# Patient Record
Sex: Male | Born: 1960
Health system: Southern US, Community
[De-identification: ages and names within clinical notes are randomized; demographics above are authoritative.]

## PROBLEM LIST (undated history)

## (undated) DIAGNOSIS — E785 Hyperlipidemia, unspecified: Secondary | ICD-10-CM

## (undated) DIAGNOSIS — K219 Gastro-esophageal reflux disease without esophagitis: Secondary | ICD-10-CM

## (undated) DIAGNOSIS — E119 Type 2 diabetes mellitus without complications: Secondary | ICD-10-CM

## (undated) DIAGNOSIS — I10 Essential (primary) hypertension: Secondary | ICD-10-CM

## (undated) DIAGNOSIS — J309 Allergic rhinitis, unspecified: Secondary | ICD-10-CM

## (undated) DIAGNOSIS — I2510 Atherosclerotic heart disease of native coronary artery without angina pectoris: Secondary | ICD-10-CM

## (undated) HISTORY — DX: Hyperlipidemia, unspecified: E78.5

## (undated) HISTORY — DX: Essential (primary) hypertension: I10

## (undated) HISTORY — DX: Allergic rhinitis, unspecified: J30.9

## (undated) HISTORY — DX: Atherosclerotic heart disease of native coronary artery without angina pectoris: I25.10

## (undated) HISTORY — DX: Gastro-esophageal reflux disease without esophagitis: K21.9

## (undated) HISTORY — DX: Type 2 diabetes mellitus without complications: E11.9

---

## 1999-12-28 ENCOUNTER — Emergency Department (HOSPITAL_COMMUNITY): Admission: EM | Admit: 1999-12-28 | Discharge: 1999-12-28 | Payer: Self-pay | Admitting: Emergency Medicine

## 1999-12-28 ENCOUNTER — Encounter: Payer: Self-pay | Admitting: Emergency Medicine

## 2000-12-04 HISTORY — PX: CORONARY STENT PLACEMENT: SHX1402

## 2001-12-02 ENCOUNTER — Inpatient Hospital Stay (HOSPITAL_COMMUNITY): Admission: EM | Admit: 2001-12-02 | Discharge: 2001-12-04 | Payer: Self-pay | Admitting: Emergency Medicine

## 2001-12-02 ENCOUNTER — Encounter: Payer: Self-pay | Admitting: Emergency Medicine

## 2001-12-31 ENCOUNTER — Encounter (HOSPITAL_COMMUNITY): Admission: RE | Admit: 2001-12-31 | Discharge: 2002-03-31 | Payer: Self-pay | Admitting: Cardiology

## 2008-05-10 ENCOUNTER — Emergency Department (HOSPITAL_COMMUNITY): Admission: EM | Admit: 2008-05-10 | Discharge: 2008-05-10 | Payer: Self-pay | Admitting: Emergency Medicine

## 2008-05-21 ENCOUNTER — Emergency Department (HOSPITAL_COMMUNITY): Admission: EM | Admit: 2008-05-21 | Discharge: 2008-05-21 | Payer: Self-pay | Admitting: Emergency Medicine

## 2008-11-28 IMAGING — CT CT ABDOMEN W/ CM
2 of 5 series · 17 of 46 positions shown, 19 images · IV contrast (omni 300/water & 100 ML OMNI 300)
Comparison: None.

CT ABDOMEN

CLINICAL DATA: 46-year-old male with right abdominal pain.

CT ABDOMEN AND PELVIS WITH CONTRAST
TECHNIQUE: Multidetector CT imaging of the abdomen and pelvis was
performed using the standard protocol following bolus
administration of intravenous contrast.
Contrast: 100 ml [id number]-X99.

[Series 2: routine abdomen · axial · 0.76mm/px · z∈[-435,-45]mm · 14 of 83 slices shown, 16 images]
[im 5/83  soft-tissue]
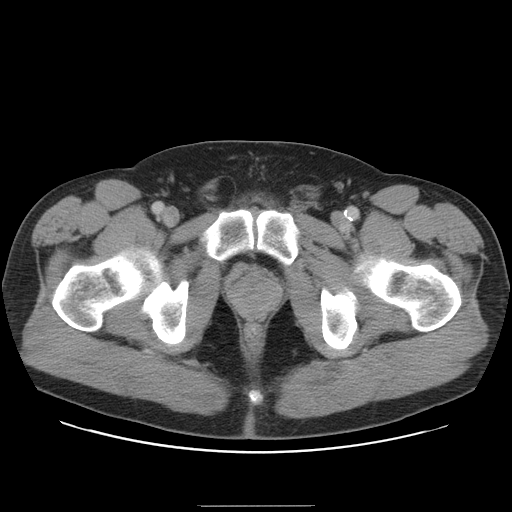
[im 5/83  bone]
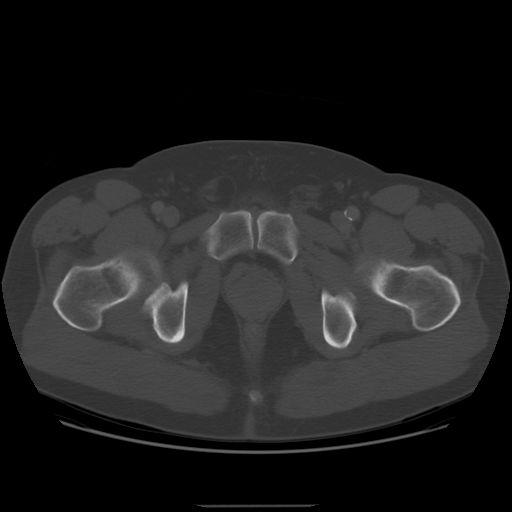
[im 10/83  soft-tissue]
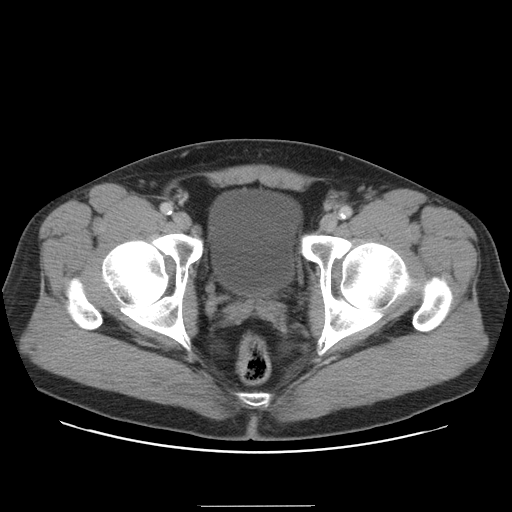
[im 19/83  soft-tissue]
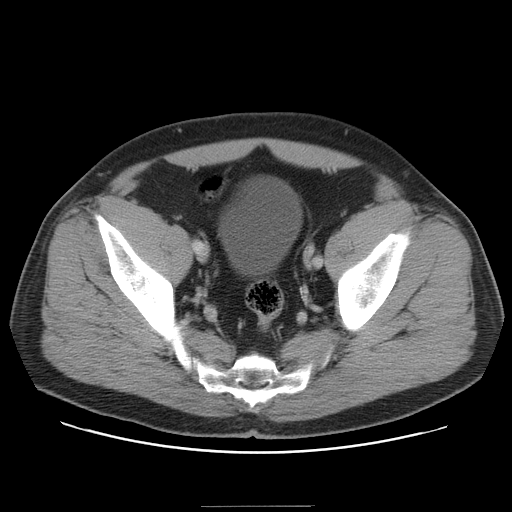
[im 23/83  soft-tissue]
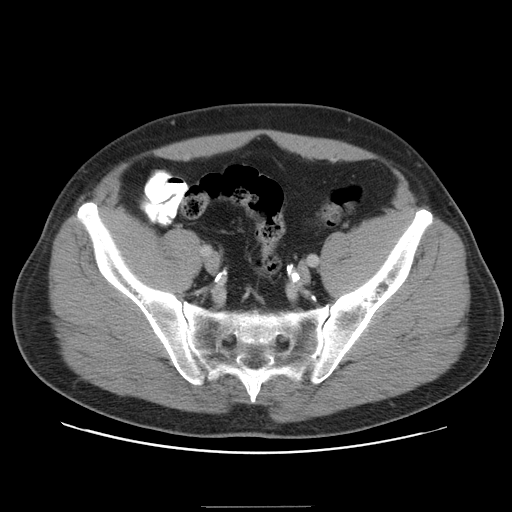
[im 28/83  soft-tissue]
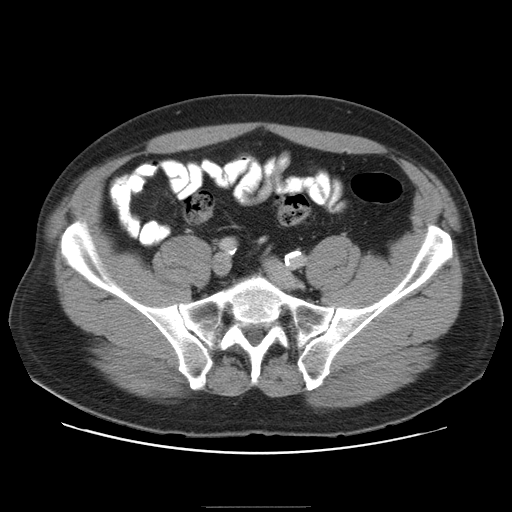
[im 32/83  soft-tissue]
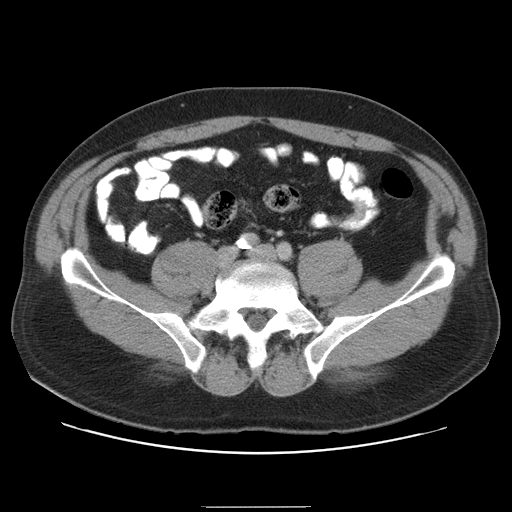
[im 37/83  soft-tissue]
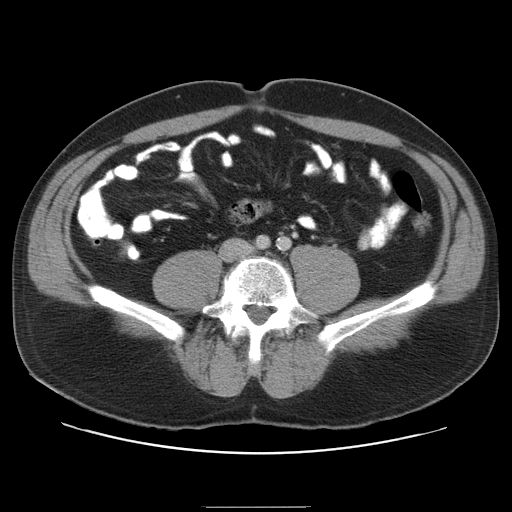
[im 46/83  soft-tissue]
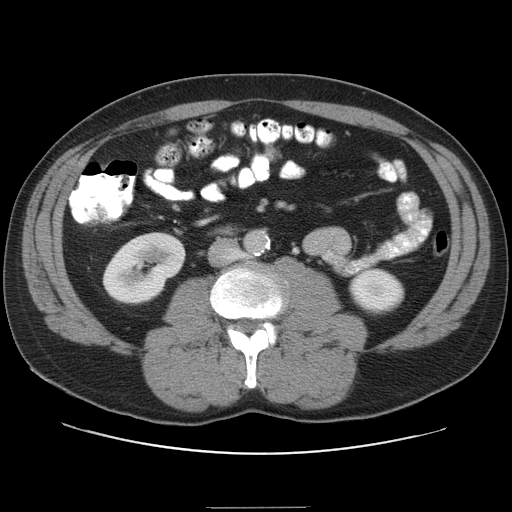
[im 51/83  soft-tissue]
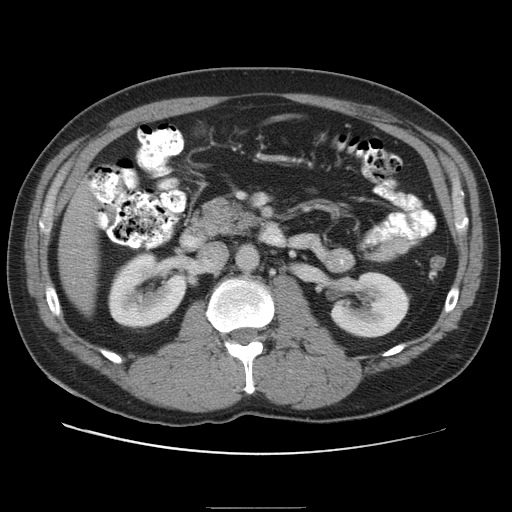
[im 51/83  bone]
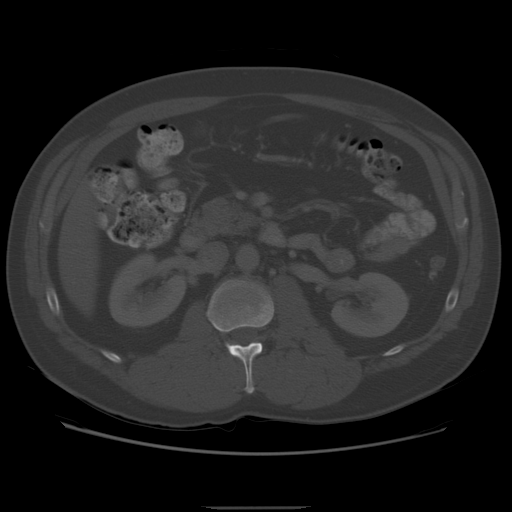
[im 55/83  soft-tissue]
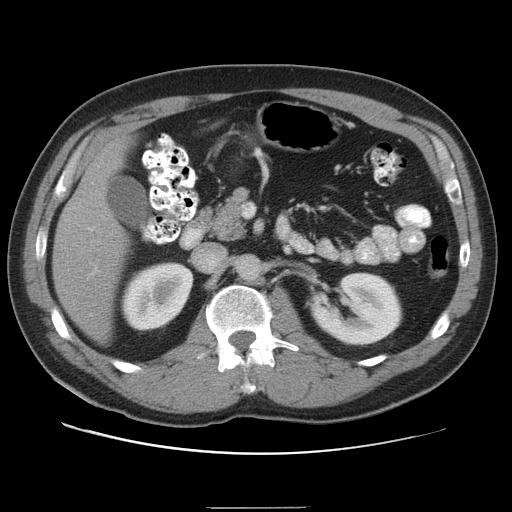
[im 60/83  soft-tissue]
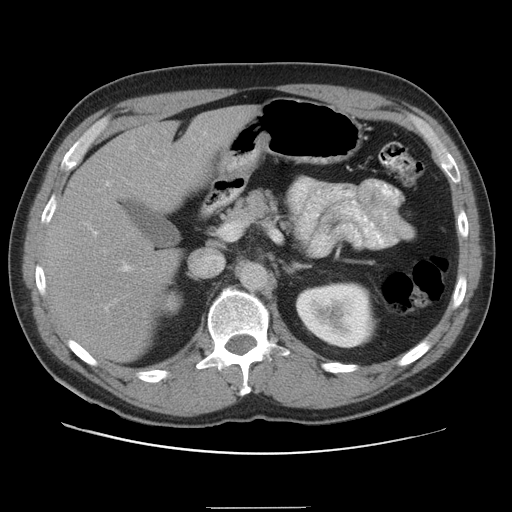
[im 64/83  soft-tissue]
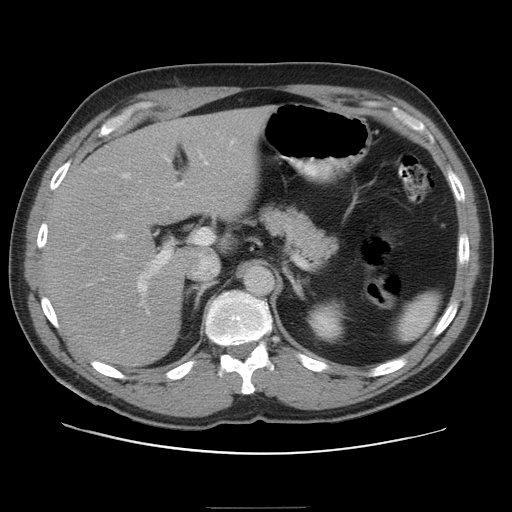
[im 73/83  soft-tissue]
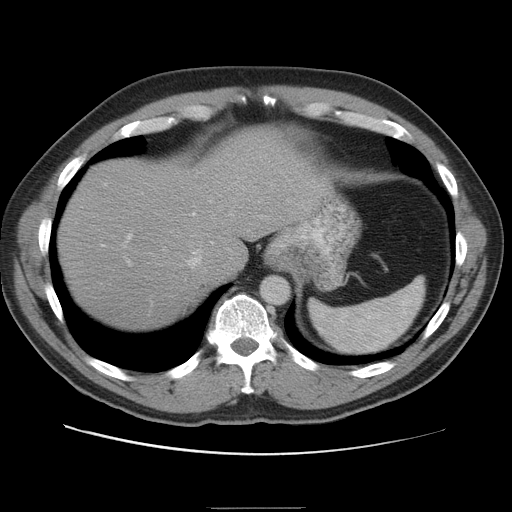
[im 78/83  soft-tissue]
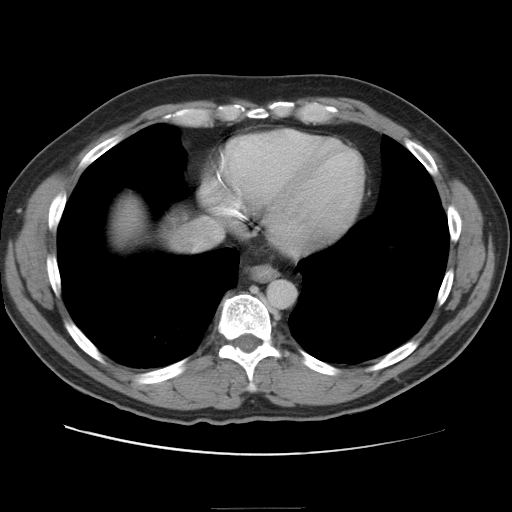

[Series 401: reformatted · coronal · 0.90mm/px · 3 of 99 slices shown]
[im 33/99  soft-tissue]
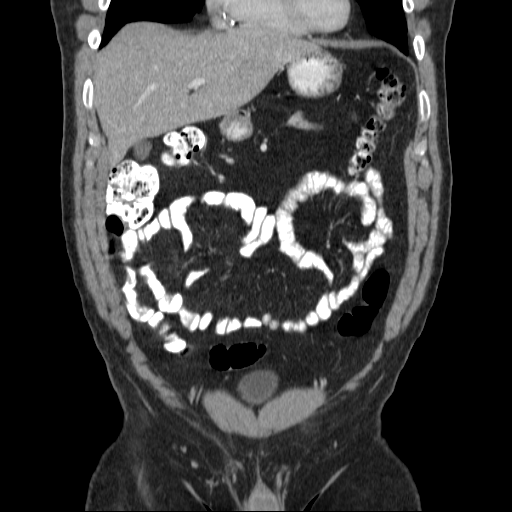
[im 44/99  soft-tissue]
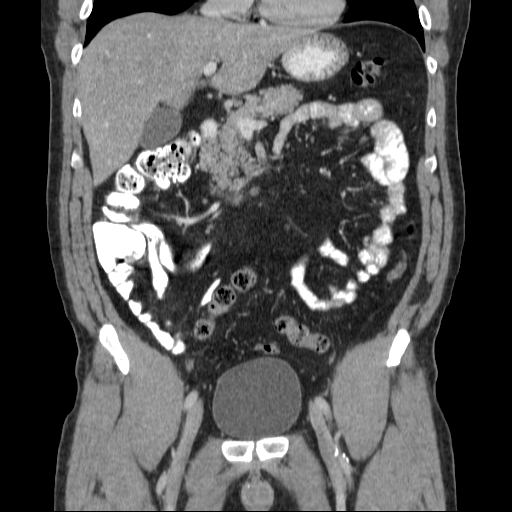
[im 55/99  soft-tissue]
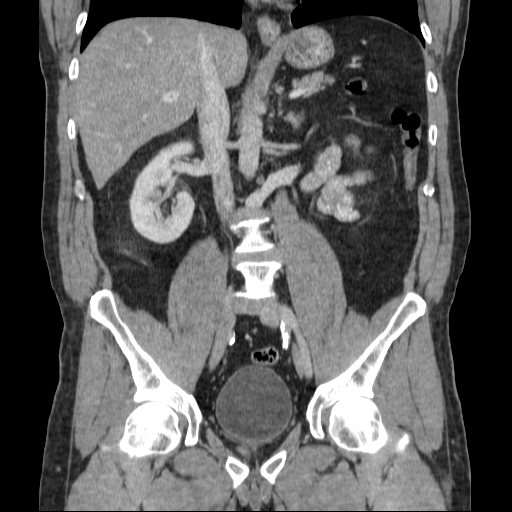

[17 of 46 positions shown; findings below may reference images not displayed]

FINDINGS: Minor dependent atelectasis.  The no acute osseous
abnormality.  Normal the liver, gallbladder, spleen, pancreas,
adrenal glands, left kidney, right kidney, stomach, duodenum,
proximal and distal small bowel loops are normal.  Normal appendix.
Visualized colon is normal aside from distal diverticulosis.  Oral
contrast has reached the splenic flexure.  Also, the sigmoid is
redundant.  No free fluid.  Portal venous system is within normal
limits.  No lymphadenopathy or focal inflammation.  Calcified
atherosclerosis of the aorta.  Incidental left retroaortic renal
vein.
IMPRESSION: No acute or inflammatory findings in the abdomen.  Distal colon
diverticulosis.

CT PELVIS
FINDINGS: No free fluid.  Redundant sigmoid colon.  Bladder is
within normal limits.  Atherosclerosis of pelvic arteries which
remain patent.  No lymphadenopathy.  No acute osseous abnormality.
Lower lumbar disc protrusions are noted.
IMPRESSION: No acute or inflammatory findings in the pelvis.

## 2011-04-21 NOTE — H&P (Signed)
[place]  Patient:    Johnny Dorsey, Johnny Dorsey Visit Number: [id number] MRN: [id number]          Service Type: MED Location: [phone] Attending Physician:  Lorre Nick Dictated by:   Rollene Rotunda, M.D. [facility] Admit Date:  12/02/2001                           History and Physical  DATE OF BIRTH:  [birth year].  PRIMARY CARE:  None.  REASON FOR PRESENTATION:  Patient with chest pain.  HISTORY OF PRESENT ILLNESS:  The patient is a pleasant 50 year old gentleman with no prior cardiac history.  He says that over the past couple of months, he has had intermittent chest pain.  These have been rare and mild episodes. However, two days prior to this admission he developed chest discomfort while at work.  He was not particularly exerting himself at that time.  He described it as an indigestion type pain that resolved spontaneously after several minutes.  Yesterday he had a more severe episode while using a wood splitter. This episode lasted for approximately one hour.  It was somewhat different than previous indigestion.  It was substernal and heavy.  It again resolved spontaneously. This morning, he awoke with this chest discomfort.  It was 7/10 in intensity.  There was some discomfort in his left shoulder.  There was no shortness of breath, nausea, vomiting, PND, orthopnea, diaphoresis or palpitations.  He had the pain for a total of approximately three and a half hours.  It went away spontaneously while he was in the emergency room.  He did receive an aspirin.  PAST MEDICAL HISTORY:  Hyperlipidemia (the patient reports total cholesterol 243 a couple of years ago.  This has not been treated with medication), palpitations evaluated by primary M.D. recently, multiple fractures, right cataract secondary to trauma.  PAST SURGICAL HISTORY:  Bilateral elbow surgeries, right lens implant.  ALLERGIES:  No known drug allergies.  MEDICATIONS:   None.  SOCIAL HISTORY:  The patient is married.  He has four children.  He is a Corporate investment banker.  He has been smoking two packs per day for 20 years.  FAMILY HISTORY:  Contributory for his father having myocardial infarction at age 80.  REVIEW OF SYSTEMS:  Positive for gastroesophageal reflux disease.  Otherwise negative for all other systems.  PHYSICAL EXAMINATION:  GENERAL APPEARANCE: The patient is in no distress.  VITAL SIGNS:  Temperature 98.6, pulse 64 and regular, blood pressure 104/71.  HEENT:  Eye lids unremarkable. Pupils are equal, round and reactive to light. Fundi not visualized.  Oral mucosa unremarkable.  NECK:  No jugular venous distension.  Wave form within normal limits.  Carotid upstroke brisk and symmetric, no bruits, or thyromegaly.  LYMPHATICS:  No cervical, axillary or inguinal adenopathy.  LUNGS:  Clear to auscultation bilaterally.  BACK:  No costovertebral angle tenderness.  CHEST:  Unremarkable.  CARDIOVASCULAR:  PMI not displaced or sustained, S1 and S2 within normal limits, no S3, no S4, no murmurs.  ABDOMEN:  Flat, positive bowel sounds, normal in frequency and pitch.  No bruits, no rebound, no guarding, midline pulse, no mass, no hepatomegaly, no splenomegaly.  SKIN:  No rashes, no nodules.  EXTREMITIES:  There are 2+ pulses throughout, no edema, no cyanosis, no clubbing, no bruits.  NEUROLOGICAL:  Oriented to person, place and time.  Cranial nerves II-XII grossly intact.  Motor grossly intact  throughout.  Chest x-ray shows no acute disease.  EKG shows sinus rhythm, rate 68, axis within normal limits, intervals within normal limits, inferior T-wave inversions consistent with ischemia.  LABS:  Hemoglobin 16.8, white blood cells 10.6, platelets 233. Sodium 139, potassium 3.7, BUN 15, creatinine 0.9, glucose 109.  INR 0.9, troponin 1.83.  ASSESSMENT AND PLAN: 1. Chest pain, the patient is having an acute coronary syndrome.  His  chest    pain is currently resolved.  He has ruled in with a small troponin bump.    The plan will be for aspirin and Lovenox.  Will use a 2B3 inhibitor if he    has recurrent chest pain.  He will have elective cardiac catheterization    unless he has further chest pain, at which time he will be moved to a more    urgent case.  I have had a long discussion regarding this diagnosis with    the patient and his wife.  They understand the risks and benefits of the    plan outlined and agree to proceed. 2. Hyperlipidemia.  The patient will be started empirically on Pravachol 20    mg q.d.   A lipid profile will be obtained. 3. Tobacco.  We had a long discussion (greater than 30 minutes) about the need    to stop smoking and the ways we will accomplish this.  He may elect    as an outpatient to use a nicotine patch.  We will also discuss the    use of Zyban. Dictated by:   Rollene Rotunda, M.D. [facility] Attending Physician:  Lorre Nick DD:  12/02/01 TD:  12/02/01 Job: [id number]

## 2011-04-21 NOTE — Discharge Summary (Signed)
[place]  Patient:    Johnny Dorsey, Johnny Dorsey Visit Number: [id number] MRN: [id number]          Service Type: MED Location: [phone] Attending Physician:  Rollene Rotunda Dictated by:   Joellyn Rued, P.A.-C. Admit Date:  12/02/2001 Discharge Date: 12/04/2001   CC:         Redmond Baseman, M.D.   Referring Physician Discharge [place]  DATE OF BIRTH:  03-12-1961  SUMMARY OF HISTORY:  Johnny Dorsey is a 50 year old male without prior cardiac history who over the preceding couple months he has had intermittent chest discomfort that he describes as rare and mild episodes.  However, two days prior to admission he developed discomfort while at work unassociated with any particular exertion.  He described it as an indigestion-type feeling that resolved spontaneously.  However, on the day prior to admission he had a severe episode while using a wood splitter that lasted at least an hour and was somewhat different, and he described this as heavy.  It again resolved spontaneously.  On the morning of admission he awoke with the same heaviness, giving it a 7 on a scale of 0-10, radiating into his left shoulder.  However, he did not have any associated symptoms.  The discomfort lasted for at least three-and-a-half hours and went away spontaneously while he was in the emergency room.  His history is notable for hyperlipidemia that has been untreated, and two pack-per-day cigarettes for 20 years.  LABORATORY DATA:  Admission H&H was 16.8 and 46.1, normal indices, platelets 233, wbcs 10.6.  PT 12.3, PTT 29.  Sodium 139, potassium 3.7, BUN 15, creatinine 0.9.  Fasting lipids showed a total cholesterol 232, triglycerides 154, HDL 36, LDL 165.  Admission CK total was 269 with an MB of 27.3 and a troponin of 1.83.  CK-MBs continued to rise until the third set, which was 433 and MB 49.7, troponin 3.73.  Subsequent enzymes were declining.  Prior  to discharge, H&H was 14.5 and 41.7, platelets 227, wbcs 9.4.  Sodium 140, potassium 3.8, BUN 10, creatinine 1.0, glucose 132.  EKGs on admission showed sinus bradycardia, normal sinus rhythm, inferior T wave inversion.  HOSPITAL COURSE:  Johnny Dorsey was admitted to [facility] and placed on aspirin and Lovenox.  Catheterization performed on December 31 by Dr. Juanda Chance showed a 95% mid and a 100% distal RCA.  Both were angioplastied and stented, reducing them to 0%.  His ejection fraction was 50% with inferior hypokinesis.  He did not have any atherosclerosis in the LAD or circumflex. Post sheath removal and bedrest he was ambulating the hall without difficulty and catheterization site was intact.  On reviewing the chart on January 1, Dr. Antoine Poche felt that he could be discharged home.  DISCHARGE DIAGNOSES: 1. Non-Q-wave myocardial infarction, inferior. 2. Status post angioplasty stenting of the mid and distal right coronary    artery as previously described. 3. Hyperlipidemia. 4. Tobacco use.  DISPOSITION:  He is discharged home.  MEDICATIONS:  Dr. Antoine Poche spent a considerable amount of time discussing medications and herbal remedies with the patient.  He suggested to the patient that at this time that he not continue to take any herbal medications.  He was asked to bring all medications and his herbal medications to his office appointment for review.  His prescription medications include: 1. Plavix 75 mg q.d. for four weeks. 2. Coated aspirin 325 mg q.d. 3. Toprol-XL 25 mg q.d. 4.  Pravachol 20 mg q.h.s. 5. Sublingual nitroglycerin as needed.  He was told that if his antioxidant multivitamin at home contained 800 mcg of folic acid and vitamins B6 and B12 he may continue this daily; otherwise he is to take over-the-counter doses.  ACTIVITY:  He was advised no lifting, driving, sexual activity, or heavy exertion until seen by the physician.  He was instructed no work  for at least three weeks.  DIET:  Maintain low salt/fat/cholesterol diet.  WOUND CARE:  If he had any problems with his catheterization site he was asked to call.  SPECIAL INSTRUCTIONS:  He was advised no smoking or tobacco products.  FOLLOW-UP:  He will need fasting lipids and LFTs in six weeks.  He also needs a cardiac rehab referral.  He was asked to call our office in the morning to arrange a follow-up two-week appointment either with Dr. Antoine Poche or if Dr. Antoine Poche does not have an appointment available, with the P.A. when Dr. Antoine Poche is in the office.  Dr. Antoine Poche was also considering at some future date to check homocysteine levels and lipoprotein levels. Dictated by:   Joellyn Rued, P.A.-C. Attending Physician:  Rollene Rotunda DD:  12/04/01 TD:  12/04/01 Job: [id number]

## 2011-04-21 NOTE — Cardiovascular Report (Signed)
[place]. [facility]  Patient:    Johnny Dorsey, Johnny Dorsey Visit Number: [id number] MRN: [id number]          Service Type: MED Location: [phone] Attending Physician:  Rollene Rotunda Dictated by:   Everardo Beals Juanda Chance, M.D. [facility] Proc. Date: 12/03/01 Admit Date:  12/02/2001   CC:         Redmond Baseman, M.D.  Rollene Rotunda, M.D. [facility]   Cardiac Catheterization  INDICATIONS:  Mr. Lanae Boast is 50 years old and works in [employer].  He has no prior history of known heart disease, but has been having chest pain off and on over the past month.  He had a severe episode that brought him to the hospital and his EKG showed inferior T wave changes and small inferior Q waves and his enzymes were positive for non-Q wave inferior myocardial infarction. He was started on Integrilin and heparin and scheduled for catheterization today.  DESCRIPTION OF PROCEDURE:  The procedure was performed via the right femoral artery using arterial sheath and 6 French preformed coronary catheters. A front wall arterial puncture was performed and Omnipaque contrast was used. After completion of the diagnostic study, we made the decision to proceed with intervention on the totally occluded right coronary artery.  The patient was given weight-adjusted heparin upon ACT greater than 200 seconds and was already on an Integrilin infusion.  We used a Medtronic JR4 guiding catheter with sideholes and a short Luge wire.  We crossed the lesions in the mid and distal right coronary artery with the wire without too much difficulty.  We initially dilated with a 2.5 x 15 mm Maverick performing one inflation of 8 atmospheres for 30 seconds in the midlesion and a second inflation up to 6 atmospheres for 30 seconds in the distal lesion.  We then made the decision to stent the distal vessel.  We used a 2.5 x 12 mm Express stent and deployed this with one inflation of 15 atmospheres for 32 seconds. We  then direct stented the midlesion using a F7 AVE Zipper system.  We used a 4.0 x 15 mm stent and deployed this with one inflation of 16 atmospheres for 30 seconds.  Repeat diagnostic studies were then performed through the guiding catheter.  The patient tolerated the procedure well and left the laboratory in satisfactory condition.  RESULTS:  The left main coronary artery was free of significant disease.  The left anterior descending artery gave rise to three septal perforators and a large diagonal branch.  These and the LV proper were free of significant disease.  The circumflex artery gave rise to an intermediate branch and an AV branch. These vessels were free of significant disease.  The right coronary artery was a dominant vessel that gave rise to a conus branch and a right ventricular branch and was completely occluded distally. There was also a 95% stenosis in its midportion.  The distal right coronary artery filled by fairly well-developed collaterals from the left coronary system.  The left ventriculogram performed in the RAO projection showed hypokinesis of the inferior and inferobasal wall.  The overall wall motion was fairly well preserved with an estimated ejection fraction of 50%.  Following stenting of the midstenosis in the right coronary artery the percent diameter narrowing improved from 95% to 0%.  Following stenting of the distal stenosis in the right coronary artery, the percent diameter narrowing improved from 100% to 0%.  The aortic pressure was 104/67 with mean of  82 and the left ventricular pressure was 104/23.  CONCLUSION: 1. Coronary artery disease, status post recent non-Q wave diaphragmatic wall    infarction with no major obstruction in the left anterior descending artery    and circumflex systems, 95% stenosis in the mid right coronary artery, and    total occlusion of the distal right coronary artery with inferior wall    hypokinesis. 2.  Successful placement of tandem nonoverlying stents in the mid and distal    right coronary artery with improvement in the percent diameter narrowing in    the midstenosis from 95% to 0% and improvement in the percent diameter    narrowing in the distal stenosis from 100% to 0%.  DISPOSITION:  The patient was returned to the postanesthesia unit for further observation. Dictated by:   Everardo Beals Juanda Chance, M.D. [facility] Attending Physician:  Rollene Rotunda DD:  12/03/01 TD:  12/03/01 Job: [id number]

## 2011-08-31 LAB — DIFFERENTIAL
Basophils Absolute: 0
Blasts: 0
Eosinophils Absolute: 0.2
Eosinophils Relative: 2
Eosinophils Relative: 1
Lymphocytes Relative: 23
Lymphs Abs: 1.6
Monocytes Relative: 5
Neutrophils Relative %: 58
nRBC: 0

## 2011-08-31 LAB — CBC
HCT: 47.4
HCT: 48.9
Hemoglobin: 16.9
Hemoglobin: 17.6 — ABNORMAL HIGH
MCHC: 35.7
MCHC: 36
MCV: 94.6
MCV: 93.8
Platelets: 273
RBC: 5.01
RBC: 5.22
RDW: 13.4
WBC: 7.1

## 2011-08-31 LAB — LIPASE, BLOOD: Lipase: 26

## 2011-08-31 LAB — COMPREHENSIVE METABOLIC PANEL
ALT: 56 — ABNORMAL HIGH
AST: 35
Albumin: 4.1
Alkaline Phosphatase: 54
BUN: 13
BUN: 12
CO2: 25
CO2: 26
Calcium: 9
Calcium: 9.6
Chloride: 103
Creatinine, Ser: 0.97
Creatinine, Ser: 0.93
GFR calc Af Amer: >60
GFR calc Af Amer: >60
GFR calc non Af Amer: >60
GFR calc non Af Amer: >60
Glucose, Bld: 157 — ABNORMAL HIGH
Glucose, Bld: 133 — ABNORMAL HIGH
Potassium: 4.4
Sodium: 136
Total Bilirubin: 0.8
Total Protein: 7

## 2011-08-31 LAB — URINALYSIS, ROUTINE W REFLEX MICROSCOPIC
Bilirubin Urine: NEGATIVE
Bilirubin Urine: NEGATIVE
Hgb urine dipstick: NEGATIVE
Ketones, ur: NEGATIVE
Nitrite: NEGATIVE
Specific Gravity, Urine: 1.006
Urobilinogen, UA: 0.2
pH: 6.5

## 2013-07-24 ENCOUNTER — Encounter: Payer: Self-pay | Admitting: *Deleted

## 2013-07-24 ENCOUNTER — Encounter: Payer: Commercial Managed Care - PPO | Attending: Family Medicine | Admitting: *Deleted

## 2013-07-24 VITALS — Ht 69.5 in | Wt 211.8 lb

## 2013-07-24 DIAGNOSIS — E119 Type 2 diabetes mellitus without complications: Secondary | ICD-10-CM | POA: Insufficient documentation

## 2013-07-24 DIAGNOSIS — Z713 Dietary counseling and surveillance: Secondary | ICD-10-CM | POA: Insufficient documentation

## 2013-07-24 NOTE — Progress Notes (Signed)
Medical Nutrition Therapy:  Appt start time: 1415 end time:  1515.  Assessment:  Primary concern today: Type 2 diabetes. Patient recently diagnosed with diabetes with a HgbA1c of 7.6. He has an extensive family history of diabetes. He has made may heathy changes including limiting starches, eliminating soda, choosing healthy snacks, and exercising regularly. He has lost 20 pounds. His goal is to be under 200 pounds He is currently not on medication for diabetes. He checks his BG once daily, with AM readings 120-130s.  MEDICATIONS: Milk thistle, metamuscil, calcium + magnesium, vitamin D, trace minerals, CoQ10, omega 3, red yeast rice, cinnamon, garlic, prilosec   DIETARY INTAKE:   Usual eating pattern includes 3 meals and 1 snacks per day.  24-hr recall:  B (9 AM): Oatmeal, scrambled egg, water, coffee Snk ( AM): None  L ( PM): Grilled chicken, pinto beans OR grilled chicken wrap on whole wheat OR chicken salad with ranch Snk ( PM): None D ( PM): Chicken/fish, vegetable OR wraps Snk ( PM): Sometimes, Yogurt, cereal, pita chips with hummus, fruit Beverages: Water, coffee, occasional beer  Usual physical activity: Walking 3 miles daily (slow jogging), 100 pushups every morning,   Estimated energy needs: 1600 calories 180 g carbohydrates 100 g protein 53 g fat  Progress Towards Goal(s):  In progress.   Nutritional Diagnosis:  NB-1.1 Food and nutrition-related knowledge deficit As related to diabetes.  As evidenced by no prior education.    Intervention:  Nutrition counseling. We discussed basic carb counting, including foods with carbs, label reading, portion size, and meal planning.   Goals:  1. 3-4 carb servings per meal, 1-2 servings per snack. 2. 1 pound weight loss per week. Goal weight: <200 pounds.  3. Monitor portion size of foods.  4. Read food label for total carbohydrates, serving size, and calories.  5. Continue current exercise regimen.   Handouts given during visit  include:  Living well with diabetes booklet  Yellow meal plan card  Monitoring/Evaluation:  Dietary intake, exercise, blood glucose, and body weight in 2 month(s).

## 2013-08-30 ENCOUNTER — Other Ambulatory Visit: Payer: Self-pay | Admitting: Interventional Cardiology

## 2013-08-30 DIAGNOSIS — E785 Hyperlipidemia, unspecified: Secondary | ICD-10-CM

## 2013-08-30 DIAGNOSIS — Z79899 Other long term (current) drug therapy: Secondary | ICD-10-CM

## 2013-09-25 ENCOUNTER — Ambulatory Visit: Payer: Commercial Managed Care - PPO | Admitting: *Deleted

## 2013-09-26 ENCOUNTER — Other Ambulatory Visit (INDEPENDENT_AMBULATORY_CARE_PROVIDER_SITE_OTHER): Payer: Commercial Managed Care - PPO

## 2013-09-26 DIAGNOSIS — Z79899 Other long term (current) drug therapy: Secondary | ICD-10-CM

## 2013-09-26 DIAGNOSIS — E785 Hyperlipidemia, unspecified: Secondary | ICD-10-CM

## 2013-09-26 LAB — LIPID PANEL
Cholesterol: 195 mg/dL (ref 0–200)
LDL Cholesterol: 140 mg/dL — ABNORMAL HIGH (ref 0–99)

## 2013-09-26 LAB — ALT: ALT: 30 U/L (ref 0–53)

## 2013-09-27 LAB — VITAMIN D 25 HYDROXY (VIT D DEFICIENCY, FRACTURES): Vit D, 25-Hydroxy: 52 ng/mL (ref 30–89)

## 2013-09-29 ENCOUNTER — Telehealth: Payer: Self-pay | Admitting: Pharmacist

## 2013-09-29 DIAGNOSIS — E785 Hyperlipidemia, unspecified: Secondary | ICD-10-CM

## 2013-09-29 DIAGNOSIS — Z79899 Other long term (current) drug therapy: Secondary | ICD-10-CM

## 2013-09-29 NOTE — Telephone Encounter (Signed)
RF:  CAD, Diabetes, HTN, age - LDL goal < 70, non-HDL goal < 100. Meds:  Crestor 5 mg qd, Zetia 5 mg qd, Niaspan 2,000 mg qhs. Intolerant to all other statins doses except crestor 5 qd. Trouble with Zetia 10 mg qd in past as well. Welchol was too expensive. LDL was 140 mg/dL (was ~ 478 mg/dL last time) Losing weight now with dietician and seems to be going well.   He has lost a total of 30 lbs since cutting out carbs and sugar.  His A1C dropped from 8.0 to 6.1, and ALT back to normal as well.   Patient states he forgets to take his Zetia half of the time as he was trying to take it at night.   Patient declined [facility] study in the past for lipid lowering therapy. Plan: 1.  Patient instructed to take Zetia 5 mg qd earlier in the day so as long as he takes it 7 days/week. 2.  Continue Crestor 5 mg qd and niaspan 2 g/d.  Continue niaspan given LDL and non-HDL still elevated on current regimen.  If these continue to drop, will try to reduce or stop niaspan. 3. Continue weight loss efforts.  Patient notified, and he shows verbal understanding of plan.   Recheck lipid / liver 01/28/14.  Lab set up.

## 2013-10-03 ENCOUNTER — Telehealth: Payer: Self-pay | Admitting: Interventional Cardiology

## 2013-10-03 NOTE — Telephone Encounter (Signed)
Gave pt activation code for my chart and went over My Chart with pt.

## 2013-10-03 NOTE — Telephone Encounter (Signed)
New Problem:  Pt states he recently had blood work done and Riki Rusk went over those results with him... However, pt states he would like the nurse to help him locate his labs on his MyChart/Patient Portal... Please advise

## 2013-10-09 ENCOUNTER — Other Ambulatory Visit: Payer: Self-pay

## 2014-01-28 ENCOUNTER — Other Ambulatory Visit (INDEPENDENT_AMBULATORY_CARE_PROVIDER_SITE_OTHER): Payer: Commercial Managed Care - PPO

## 2014-01-28 DIAGNOSIS — E785 Hyperlipidemia, unspecified: Secondary | ICD-10-CM

## 2014-01-28 DIAGNOSIS — Z79899 Other long term (current) drug therapy: Secondary | ICD-10-CM

## 2014-01-28 LAB — LIPID PANEL
Cholesterol: 219 mg/dL — ABNORMAL HIGH (ref 0–200)
HDL: 40.6 mg/dL (ref >39.00)
Total CHOL/HDL Ratio: 5
Triglycerides: 93 mg/dL (ref 0.0–149.0)
VLDL: 18.6 mg/dL (ref 0.0–40.0)

## 2014-01-28 LAB — HEPATIC FUNCTION PANEL
ALK PHOS: 49 U/L (ref 39–117)
ALT: 30 U/L (ref 0–53)
AST: 19 U/L (ref 0–37)
Albumin: 4 g/dL (ref 3.5–5.2)
BILIRUBIN DIRECT: 0 mg/dL (ref 0.0–0.3)
BILIRUBIN TOTAL: 0.5 mg/dL (ref 0.3–1.2)
Total Protein: 7.5 g/dL (ref 6.0–8.3)

## 2014-01-28 LAB — LDL CHOLESTEROL, DIRECT: Direct LDL: 161.8 mg/dL

## 2014-02-09 ENCOUNTER — Telehealth: Payer: Self-pay | Admitting: Cardiology

## 2014-02-09 DIAGNOSIS — E785 Hyperlipidemia, unspecified: Secondary | ICD-10-CM

## 2014-02-09 MED ORDER — RED YEAST RICE 600 MG PO CAPS: ORAL_CAPSULE | ORAL | Status: DC

## 2014-02-09 NOTE — Telephone Encounter (Signed)
Pt notified. Meds updated and labs ordered.  

## 2014-02-09 NOTE — Telephone Encounter (Signed)
Message copied by Theda SersSTEGALL, Ymani Porcher H on Mon Feb 09, 2014  4:45 PM ------      Message from: SMART, Gaspar SkeetersJEREMY G      Created: Tue Feb 03, 2014  6:14 PM       I've seen worse LFT elevations with red yeast rice than I've seen with Crestor.  Patient would get a better LDL reduction with Crestor than red yeast rice, and also less likely to cause LFT elevations.  His liver enzymes were doing okay on Crestor, unless he got some blood work done that [employer] not aware of.  Patient would be better off being on Crestor 5 mg once daily instead of red yeast rice, however if he is set on stating on red yeast rice instead, the dose he needs to be on is 2 twice daily (4 total daily).  Needs to recheck lipid panel and hepatic panel 8 weeks later.  Please document which regimen patient going to take.  He should NOT take red yeast rice and crestor together though.  Continue Zetia and niacin. ------

## 2014-02-27 ENCOUNTER — Encounter: Payer: Self-pay | Admitting: Interventional Cardiology

## 2014-03-24 ENCOUNTER — Encounter: Payer: Self-pay | Admitting: Interventional Cardiology

## 2014-04-13 ENCOUNTER — Other Ambulatory Visit: Payer: Commercial Managed Care - PPO

## 2014-04-14 ENCOUNTER — Ambulatory Visit: Payer: Commercial Managed Care - PPO | Admitting: Interventional Cardiology

## 2014-05-04 ENCOUNTER — Encounter: Payer: Self-pay | Admitting: Interventional Cardiology

## 2014-05-04 ENCOUNTER — Ambulatory Visit (INDEPENDENT_AMBULATORY_CARE_PROVIDER_SITE_OTHER): Payer: Commercial Managed Care - PPO | Admitting: Interventional Cardiology

## 2014-05-04 ENCOUNTER — Other Ambulatory Visit: Payer: Commercial Managed Care - PPO

## 2014-05-04 ENCOUNTER — Telehealth: Payer: Self-pay | Admitting: Interventional Cardiology

## 2014-05-04 VITALS — BP 138/86 | HR 61 | Ht 70.0 in | Wt 224.0 lb

## 2014-05-04 DIAGNOSIS — Z79899 Other long term (current) drug therapy: Secondary | ICD-10-CM

## 2014-05-04 DIAGNOSIS — I2510 Atherosclerotic heart disease of native coronary artery without angina pectoris: Secondary | ICD-10-CM | POA: Insufficient documentation

## 2014-05-04 DIAGNOSIS — E782 Mixed hyperlipidemia: Secondary | ICD-10-CM | POA: Insufficient documentation

## 2014-05-04 DIAGNOSIS — E785 Hyperlipidemia, unspecified: Secondary | ICD-10-CM

## 2014-05-04 DIAGNOSIS — I10 Essential (primary) hypertension: Secondary | ICD-10-CM | POA: Insufficient documentation

## 2014-05-04 NOTE — Patient Instructions (Signed)
Your physician recommends that you continue on your current medications as directed. Please refer to the Current Medication list given to you today.  Your physician has requested that you have an exercise tolerance test. For further information please visit [email or web address]. Please also follow instruction sheet, as given.  Your physician wants you to follow-up in: 1 year with Dr. Varanasi.  You will receive a reminder letter in the mail two months in advance. If you don't receive a letter, please call our office to schedule the follow-up appointment.  

## 2014-05-04 NOTE — Progress Notes (Signed)
Patient ID: Johnny Dorsey, male   DOB: Aug 21, 1961, 53 y.o.   MRN: [id number]    [address] [place], [place]  [address] Phone: [phone]) [phone] Fax:  [phone]  Date:  05/04/2014   ID:  Johnny Dorsey, DOB Aug 21, 1961, MRN [id number]  PCP:  Sissy HoffSWAYNE,DAVID W, MD      History of Present Illness: Johnny Dorsey is a 53 y.o. male who has had CAD- LAD stent in 2002. At that time, he had musculoskeletal chest pain which prompted a CAD workup. The pain that he had was quite atypical. He actually thinks he was having a musculoskeletal issue at the same time and it was good fortune that his coronary disease was discovered. No further symptoms like that recently.   He has been exercising more recently. He has gained a few pounds. He had sx that sounded more like MSK chest pain per his report. He stopped smoking at that time since 2002. Cutting wood and running are his most strenuos activities. No chest pain with that.  Elevated LFTs on statin. He has not ben taking his Zetia regularly. He declined PharmQuest study. He has decreased salt intake and BP has improved. BP in the 126/74 range at home. If he eats salt, it may be 10 points higher. Highest reading was in the upper 130s. CAD/ASCVD:  c/o Dyspnea on exertion better.  Denies : Chest pain.  Diaphoresis.  Dizziness.  Fatigue.  Leg edema.  Nitroglycerin.  Orthopnea.  Palpitations.  Syncope.   A1C down to 6.1 from 8 a year ago.   Wt Readings from Last 3 Encounters:  05/04/14 224 lb (101.606 kg)  07/24/13 211 lb 12.8 oz (96.072 kg)     Past Medical History  Diagnosis Date  . Hyperlipidemia   . Hypertension     Current Outpatient Prescriptions  Medication Sig Dispense Refill  . aspirin 325 MG tablet Take 325 mg by mouth daily.      . calcium & magnesium carbonates (MYLANTA) 311-232 MG per tablet Take 1 tablet by mouth daily.      . cholecalciferol (VITAMIN D) 400 UNITS TABS tablet Take 1,000 Units by mouth.      . Coenzyme  Q10 (CO Q 10 PO) Take 2 tablets by mouth daily.      Marland Kitchen. ezetimibe (ZETIA) 10 MG tablet Take 5 mg by mouth daily.      . fish oil-omega-3 fatty acids 1000 MG capsule Take 2 g by mouth daily.      . milk thistle 175 MG tablet Take 175 mg by mouth daily.      . niacin (NIASPAN) 1000 MG CR tablet Take 2,000 mg by mouth at bedtime.      Marland Kitchen. omeprazole (PRILOSEC) 10 MG capsule Take 10 mg by mouth daily.      . psyllium (REGULOID) 0.52 G capsule Take 0.52 g by mouth daily.      . Red Yeast Rice 600 MG CAPS 4 capsules daily       No current facility-administered medications for this visit.    Allergies:   No Known Allergies  Social History:  The patient  reports that he has quit smoking. He does not have any smokeless tobacco history on file. He reports that he drinks alcohol. He reports that he does not use illicit drugs.   Family History:  The patient's family history is not on file.   ROS:  Please see the history of present illness.  No nausea, vomiting.  No fevers, chills.  No focal weakness.  No dysuria.    All other systems reviewed and negative.   PHYSICAL EXAM: VS:  BP 138/86  Pulse 61  Ht 5\' 10"  (1.778 m)  Wt 224 lb (101.606 kg)  BMI 32.14 kg/m2 Well nourished, well developed, in no acute distress HEENT: normal Neck: no JVD, no carotid bruits Cardiac:  normal S1, S2; RRR;  Lungs:  clear to auscultation bilaterally, no wheezing, rhonchi or rales Abd: soft, nontender, no hepatomegaly Ext: no edema Skin: warm and dry Neuro:   no focal abnormalities noted  EKG:  Normal   2/13 monitor: NSR, PVCs  ASSESSMENT AND PLAN:  CAD in native artery  Continue Aspirin Tablet, 325 MG, 1 tablet, Orally, Once a day Notes: No angina.  Never had typical angina. We'll plan for exercise treadmill test to screen for any restenosis. It has been many years since he has had a stress evaluation.  2. Hyperlipidemia  Off of Zetia Tablet, 10 MG, 1/2 tablet, Orally, Once a day; LDL 161 in 2/15; 4/15 LDL  158. Notes: Intolerant of statins , consider PCSK9 inhibitor.  Should be better with increased exercise.  3. Benign hypertension  Notes: Improved with lifestyle changes.  Takes Hawthorne.  Recommended ACE-I which was also recommended by his PMD, Dr. Azucena Cecil.  Will discuss with our pharmD.   Plan for followup in a year if his stress test is negative.   Signed, Fredric Mare, MD, [facility] 05/04/2014 8:38 AM

## 2014-05-04 NOTE — Telephone Encounter (Signed)
Elevated LFTs on statin. He has not ben taking his Zetia regularly. He declined PharmQuest PCSK-9 inhibitor study in the past.  Dr. Eldridge Dace mentions that he may be a candidate for PCSK-9 inhibitor.  H/o CAD / PCI in 2002.  He doesn't qualify for SPIRE-II at this time.  Will add his name to a list of patients to consider using these agents in when they are available later this year.  Taking hawthorn for BP.  Evidence with this medication shows it may help with DBP, but not SBP.  It is safe with ACE inhibitor.  It can cause palpitations so patient should be aware of this in case they occur.  I will call him later this year when PCSK-9 are available and will see if he is interested at that time.

## 2014-05-04 NOTE — Telephone Encounter (Signed)
Please look at my note regarding PCSK9 and hawthorne/ACE inhibitor

## 2014-05-06 NOTE — Telephone Encounter (Signed)
Agree with Jeremy's note.  Please inform patient.

## 2014-05-11 MED ORDER — LISINOPRIL 5 MG PO TABS: 5.0000 mg | ORAL_TABLET | Freq: Every day | ORAL | Status: DC

## 2014-05-11 NOTE — Addendum Note (Signed)
Addended byOrlene Plum H on: 05/11/2014 04:04 PM   Modules accepted: Orders

## 2014-05-11 NOTE — Telephone Encounter (Signed)
Pt notified Rx sent in.

## 2014-05-11 NOTE — Telephone Encounter (Signed)
Would recommend low dose lisinopril, 5 mg dialy Disp 30 for renal protection.

## 2014-05-11 NOTE — Telephone Encounter (Signed)
Per Dr. Eldridge Dace pt needs Bmet in 1 week.

## 2014-05-11 NOTE — Telephone Encounter (Signed)
Pt.notified

## 2014-05-11 NOTE — Telephone Encounter (Signed)
Pt notified. Pt wanted to know if he needs to add lisinopril to his regimen. Pt states Bp is running 130/80's consistently. Can he just stay on hawthorne or does he need to add lisinopril?

## 2014-05-11 NOTE — Addendum Note (Signed)
Addended byOrlene Plum H on: 05/11/2014 04:13 PM   Modules accepted: Orders

## 2014-05-19 ENCOUNTER — Other Ambulatory Visit: Payer: Commercial Managed Care - PPO

## 2014-05-21 ENCOUNTER — Other Ambulatory Visit (INDEPENDENT_AMBULATORY_CARE_PROVIDER_SITE_OTHER): Payer: Commercial Managed Care - PPO

## 2014-05-21 DIAGNOSIS — Z79899 Other long term (current) drug therapy: Secondary | ICD-10-CM

## 2014-05-21 LAB — BASIC METABOLIC PANEL
BUN: 17 mg/dL (ref 6–23)
CHLORIDE: 104 meq/L (ref 96–112)
CO2: 29 mEq/L (ref 19–32)
Calcium: 9 mg/dL (ref 8.4–10.5)
Creatinine, Ser: 1 mg/dL (ref 0.4–1.5)
GFR: 86.55 mL/min (ref >60.00)
Glucose, Bld: 172 mg/dL — ABNORMAL HIGH (ref 70–99)
Potassium: 4.3 mEq/L (ref 3.5–5.1)
SODIUM: 138 meq/L (ref 135–145)

## 2014-06-12 ENCOUNTER — Ambulatory Visit (INDEPENDENT_AMBULATORY_CARE_PROVIDER_SITE_OTHER): Payer: Commercial Managed Care - PPO | Admitting: Nurse Practitioner

## 2014-06-12 ENCOUNTER — Encounter: Payer: Self-pay | Admitting: Nurse Practitioner

## 2014-06-12 VITALS — BP 143/87 | HR 97

## 2014-06-12 DIAGNOSIS — I10 Essential (primary) hypertension: Secondary | ICD-10-CM

## 2014-06-12 DIAGNOSIS — I2510 Atherosclerotic heart disease of native coronary artery without angina pectoris: Secondary | ICD-10-CM

## 2014-06-12 MED ORDER — LISINOPRIL 10 MG PO TABS: 10.0000 mg | ORAL_TABLET | Freq: Every day | ORAL | Status: DC

## 2014-06-12 NOTE — Patient Instructions (Signed)
I am increasing your Lisinopril to 10 mg a day - you may take 2 of your 5 mg pills to equal this dose. The 10 mg dose is at your drug store  Limit salt  We need to recheck your kidney function in one month  See back as directed.

## 2014-06-12 NOTE — Progress Notes (Signed)
Tread Exercise Treadmill Test  Pre-Exercise Testing Evaluation Rhythm: normal sinus  Rate: 73 bpm     Test  Exercise Tolerance Test Ordering MD: Everette RankJay Varanasi, MD  Interpreting MD: Norma FredricksonLori Gerhardt, NP  Unique Test No: 1  Treadmill:  1  Indication for ETT: known ASHD  Contraindication to ETT: No   Stress Modality: exercise - treadmill  Cardiac Imaging Performed: non   Protocol: standard Bruce - maximal  Max BP:  234/86  Max MPHR (bpm):  166 85% MPR (bpm):  141  MPHR obtained (bpm):  155 % MPHR obtained:  93%  Reached 85% MPHR (min:sec):  8:09 Total Exercise Time (min-sec):  9:30  Workload in METS:  10.9 Borg Scale: 14  Reason ETT Terminated:  patient's desire to stop    ST Segment Analysis At Rest: normal ST segments - no evidence of significant ST depression With Exercise: no evidence of significant ST depression  Other Information Arrhythmia:  Yes Angina during ETT:  absent (0) Quality of ETT:  diagnostic  ETT Interpretation:  normal - no evidence of ischemia by ST analysis  Comments: Patient presents today for routine GXT. Has known CAD with remote stent to LAD in 2002. Clinically with no symptoms.   Today the patient exercised on the standard Bruce protocol for a total of 9:30 minutes.  Good exercise tolerance.  Hypertensive blood pressure response.  Clinically negative for chest pain. Test was stopped due to achievement of target HR/patient desire to stop.  EKG negative for ischemia. No significant arrhythmia noted but has occasional PVC's/PACs -  asymptomatic.   Recommendations: Continue with CV risk factor modification  Restrict salt  Increase lisinopril to 10 mg a day  BMET in one month  See back as directed.  Patient is agreeable to this plan and will call if any problems develop in the interim.   Rosalio MacadamiaLori C. Gerhardt, RN, ANP-C [facility] [address] [place], [place]  [address] [phone]

## 2014-07-13 ENCOUNTER — Other Ambulatory Visit: Payer: Commercial Managed Care - PPO

## 2015-05-31 ENCOUNTER — Other Ambulatory Visit: Payer: Self-pay

## 2020-06-08 DIAGNOSIS — M791 Myalgia, unspecified site: Secondary | ICD-10-CM | POA: Diagnosis not present

## 2020-06-08 DIAGNOSIS — Z20822 Contact with and (suspected) exposure to covid-19: Secondary | ICD-10-CM | POA: Diagnosis not present

## 2020-06-08 DIAGNOSIS — U071 COVID-19: Secondary | ICD-10-CM | POA: Diagnosis not present

## 2020-06-15 ENCOUNTER — Emergency Department (HOSPITAL_BASED_OUTPATIENT_CLINIC_OR_DEPARTMENT_OTHER): Payer: BC Managed Care – PPO

## 2020-06-15 ENCOUNTER — Emergency Department (HOSPITAL_BASED_OUTPATIENT_CLINIC_OR_DEPARTMENT_OTHER)
Admission: EM | Admit: 2020-06-15 | Discharge: 2020-06-15 | Disposition: A | Discharge status: Home / Self Care | Payer: BC Managed Care – PPO | Attending: Emergency Medicine | Admitting: Emergency Medicine

## 2020-06-15 ENCOUNTER — Encounter (HOSPITAL_BASED_OUTPATIENT_CLINIC_OR_DEPARTMENT_OTHER): Payer: Self-pay | Admitting: *Deleted

## 2020-06-15 ENCOUNTER — Other Ambulatory Visit: Payer: Self-pay

## 2020-06-15 DIAGNOSIS — I2510 Atherosclerotic heart disease of native coronary artery without angina pectoris: Secondary | ICD-10-CM | POA: Insufficient documentation

## 2020-06-15 DIAGNOSIS — Z79899 Other long term (current) drug therapy: Secondary | ICD-10-CM | POA: Diagnosis not present

## 2020-06-15 DIAGNOSIS — Z87891 Personal history of nicotine dependence: Secondary | ICD-10-CM | POA: Diagnosis not present

## 2020-06-15 DIAGNOSIS — U071 COVID-19: Secondary | ICD-10-CM | POA: Diagnosis not present

## 2020-06-15 DIAGNOSIS — R0602 Shortness of breath: Secondary | ICD-10-CM | POA: Diagnosis not present

## 2020-06-15 DIAGNOSIS — Z955 Presence of coronary angioplasty implant and graft: Secondary | ICD-10-CM | POA: Insufficient documentation

## 2020-06-15 DIAGNOSIS — R05 Cough: Secondary | ICD-10-CM | POA: Diagnosis not present

## 2020-06-15 DIAGNOSIS — R739 Hyperglycemia, unspecified: Secondary | ICD-10-CM | POA: Insufficient documentation

## 2020-06-15 DIAGNOSIS — Z7982 Long term (current) use of aspirin: Secondary | ICD-10-CM | POA: Diagnosis not present

## 2020-06-15 DIAGNOSIS — I10 Essential (primary) hypertension: Secondary | ICD-10-CM | POA: Diagnosis not present

## 2020-06-15 DIAGNOSIS — R509 Fever, unspecified: Secondary | ICD-10-CM | POA: Diagnosis not present

## 2020-06-15 DIAGNOSIS — E1165 Type 2 diabetes mellitus with hyperglycemia: Secondary | ICD-10-CM | POA: Diagnosis not present

## 2020-06-15 LAB — CBC WITH DIFFERENTIAL/PLATELET
Abs Immature Granulocytes: 0.02 10*3/uL (ref 0.00–0.07)
Basophils Absolute: 0 10*3/uL (ref 0.0–0.1)
Basophils Relative: 1 %
Eosinophils Absolute: 0 10*3/uL (ref 0.0–0.5)
Eosinophils Relative: 0 %
HCT: 45.3 % (ref 39.0–52.0)
Hemoglobin: 15.9 g/dL (ref 13.0–17.0)
Immature Granulocytes: 1 %
Lymphocytes Relative: 29 %
Lymphs Abs: 1.1 10*3/uL (ref 0.7–4.0)
MCH: 32.1 pg (ref 26.0–34.0)
MCHC: 35.1 g/dL (ref 30.0–36.0)
MCV: 91.5 fL (ref 80.0–100.0)
Monocytes Absolute: 0.5 10*3/uL (ref 0.1–1.0)
Monocytes Relative: 12 %
Neutro Abs: 2.4 10*3/uL (ref 1.7–7.7)
Neutrophils Relative %: 57 %
Platelets: 180 10*3/uL (ref 150–400)
RBC: 4.95 MIL/uL (ref 4.22–5.81)
RDW: 11.6 % (ref 11.5–15.5)
WBC: 4 10*3/uL (ref 4.0–10.5)
nRBC: 0 % (ref 0.0–0.2)

## 2020-06-15 LAB — COMPREHENSIVE METABOLIC PANEL
ALT: 36 U/L (ref 0–44)
AST: 26 U/L (ref 15–41)
Albumin: 3.5 g/dL (ref 3.5–5.0)
Alkaline Phosphatase: 56 U/L (ref 38–126)
Anion gap: 12 (ref 5–15)
BUN: 17 mg/dL (ref 6–20)
CO2: 21 mmol/L — ABNORMAL LOW (ref 22–32)
Calcium: 8.2 mg/dL — ABNORMAL LOW (ref 8.9–10.3)
Chloride: 94 mmol/L — ABNORMAL LOW (ref 98–111)
Creatinine, Ser: 1.1 mg/dL (ref 0.61–1.24)
GFR calc Af Amer: >60 mL/min (ref >60)
GFR calc non Af Amer: >60 mL/min (ref >60)
Glucose, Bld: 361 mg/dL — ABNORMAL HIGH (ref 70–99)
Potassium: 4 mmol/L (ref 3.5–5.1)
Sodium: 127 mmol/L — ABNORMAL LOW (ref 135–145)
Total Bilirubin: 0.5 mg/dL (ref 0.3–1.2)
Total Protein: 7.2 g/dL (ref 6.5–8.1)

## 2020-06-15 MED ORDER — SODIUM CHLORIDE 0.9 % IV BOLUS
1000.0000 mL | Freq: Once | INTRAVENOUS | Status: AC
  Administered 2020-06-15: 1000 mL via INTRAVENOUS

## 2020-06-15 MED ORDER — ACETAMINOPHEN 325 MG PO TABS
650.0000 mg | ORAL_TABLET | Freq: Once | ORAL | Status: AC
Start: 2020-06-15 — End: 2020-06-15
  Administered 2020-06-15: 650 mg via ORAL
  Filled 2020-06-15: qty 2

## 2020-06-15 MED ORDER — BENZONATATE 100 MG PO CAPS
100.0000 mg | ORAL_CAPSULE | Freq: Three times a day (TID) | ORAL | 0 refills | Status: DC
Start: 2020-06-15 — End: 2021-02-22

## 2020-06-15 NOTE — Discharge Instructions (Addendum)
Monitor your breathing closely.  If you develop increased shortness of breath, you likely need to be reevaluated.  Your blood sugars were elevated today.  Is important that you follow-up with your primary care doctor for further management and evaluation of your blood sugar.  Treat your symptoms with medication as needed.  Use Tylenol and ibuprofen as needed for fever or body aches. Make sure stay well-hydrated water. Use Tessalon to help with cough.  Return to the ER with any new, worsening, concerning symptoms.

## 2020-06-15 NOTE — ED Triage Notes (Signed)
Covid positive. Fever, cough.

## 2020-06-15 NOTE — ED Provider Notes (Signed)
[facility] EMERGENCY DEPARTMENT Provider Note   CSN: [id number] Arrival date & time: 06/15/20  1530     History No chief complaint on file.   Johnny Dorsey is a 59 y.o. male presenting for evaluation of fever and cough.  Patient states he tested positive for Covid 1 week ago.  He developed symptoms 4 days ago.  He states he has been feeling very drained and tired.  He reports a chest fullness and a productive cough.  He reports continued fevers at home.  He is treating his symptoms with natural remedies, no medications.  He reports a history of diabetes, states he is not on medicine for this, attempting to control it with diet.  He reports no other medical problems.  He denies shortness of breath, chest pain, vomiting, abdominal pain, urinary symptoms, abnormal bowel movements.  Additional history obtained from chart review.  Patient with a history of CAD, hyperlipidemia, hypertension.  HPI     Past Medical History:  Diagnosis Date  . CAD (coronary artery disease)    STENT TO LAD in 2002  . Hyperlipidemia   . Hypertension     Patient Active Problem List   Diagnosis Date Noted  . Coronary atherosclerosis of native coronary artery 05/04/2014  . Essential hypertension, benign 05/04/2014  . Mixed hyperlipidemia 05/04/2014    Past Surgical History:  Procedure Laterality Date  . CORONARY STENT PLACEMENT  2002       Family History  Problem Relation Age of Onset  . Hypertension Mother   . Heart disease Mother   . Heart disease Father     Social History   Tobacco Use  . Smoking status: Former Games developer  . Smokeless tobacco: Never Used  Substance Use Topics  . Alcohol use: Yes  . Drug use: No    Home Medications Prior to Admission medications   Medication Sig Start Date End Date Taking? Authorizing Provider  aspirin 325 MG tablet Take 325 mg by mouth daily.    [provider]  benzonatate (TESSALON) 100 MG capsule Take 1 capsule (100 mg total)  by mouth every 8 (eight) hours. 06/15/20   Stachia Slutsky, PA-C  calcium & magnesium carbonates (MYLANTA) 935-701 MG per tablet Take 1 tablet by mouth daily.    [provider]  cholecalciferol (VITAMIN D) 400 UNITS TABS tablet Take 1,000 Units by mouth.    [provider]  Coenzyme Q10 (CO Q 10 PO) Take 2 tablets by mouth daily.    [provider]  ezetimibe (ZETIA) 10 MG tablet Take 5 mg by mouth daily.    [provider]  fish oil-omega-3 fatty acids 1000 MG capsule Take 2 g by mouth daily.    [provider]  lisinopril (PRINIVIL,ZESTRIL) 10 MG tablet Take 1 tablet (10 mg total) by mouth daily. 06/12/14   Rosalio Macadamia, NP  milk thistle 175 MG tablet Take 175 mg by mouth daily.    [provider]  niacin (NIASPAN) 1000 MG CR tablet Take 2,000 mg by mouth at bedtime.    [provider]  NON FORMULARY HAWTHORNE EXTRACT 500 MG 2 CAPSULES DAILY.    [provider]  omeprazole (PRILOSEC) 10 MG capsule Take 10 mg by mouth daily.    [provider]  ONE TOUCH ULTRA TEST test strip 1 each by Other route as needed.  04/29/14   [provider]  psyllium (REGULOID) 0.52 G capsule Take 0.52 g by mouth daily.  [provider]  Red Yeast Rice 600 MG CAPS 4 capsules daily 02/09/14   Corky Crafts, MD    Allergies    Patient has no known allergies.  Review of Systems   Review of Systems  Constitutional: Positive for fever.  Respiratory: Positive for cough.   Musculoskeletal: Positive for myalgias.  All other systems reviewed and are negative.   Physical Exam Updated Vital Signs BP 122/87 (BP Location: Left Arm)   Pulse 84   Temp 98.9 F (37.2 C) (Oral)   Resp 18   Ht 5\' 10"  (1.778 m)   Wt 94.8 kg   SpO2 92%   BMI 29.99 kg/m   Physical Exam Vitals and nursing note reviewed.  Constitutional:      General: He is not in acute distress.    Appearance: He is well-developed.      Comments: Appears nontoxic  HENT:     Head: Normocephalic and atraumatic.  Eyes:     Extraocular Movements: Extraocular movements intact.     Conjunctiva/sclera: Conjunctivae normal.     Pupils: Pupils are equal, round, and reactive to light.  Cardiovascular:     Rate and Rhythm: Normal rate and regular rhythm.     Pulses: Normal pulses.  Pulmonary:     Effort: Pulmonary effort is normal. No respiratory distress.     Breath sounds: Normal breath sounds. No wheezing.     Comments: Clear lung sounds in all fields Abdominal:     General: There is no distension.     Palpations: Abdomen is soft. There is no mass.     Tenderness: There is no abdominal tenderness. There is no guarding or rebound.  Musculoskeletal:        General: Normal range of motion.     Cervical back: Normal range of motion and neck supple.     Right lower leg: No edema.     Left lower leg: No edema.  Skin:    General: Skin is warm and dry.     Capillary Refill: Capillary refill takes less than 2 seconds.  Neurological:     Mental Status: He is alert and oriented to person, place, and time.     ED Results / Procedures / Treatments   Labs (all labs ordered are listed, but only abnormal results are displayed) Labs Reviewed  COMPREHENSIVE METABOLIC PANEL - Abnormal; Notable for the following components:      Result Value   Sodium 127 (*)    Chloride 94 (*)    CO2 21 (*)    Glucose, Bld 361 (*)    Calcium 8.2 (*)    All other components within normal limits  CBC WITH DIFFERENTIAL/PLATELET  URINALYSIS, ROUTINE W REFLEX MICROSCOPIC    EKG None  Radiology DG Chest Portable 1 View  Result Date: 06/15/2020 CLINICAL DATA:  Shortness of breath, fever, COVID-19 positive. EXAM: PORTABLE CHEST 1 VIEW COMPARISON:  None. FINDINGS: The heart size and mediastinal contours are within normal limits. Both lungs are clear. No pneumothorax or pleural effusion is noted. The visualized skeletal structures are unremarkable.  IMPRESSION: No active disease. Electronically Signed   By: 06/17/2020 M.D.   On: 06/15/2020 16:07    Procedures Procedures (including critical care time)  Medications Ordered in ED Medications  acetaminophen (TYLENOL) tablet 650 mg (650 mg Oral Given 06/15/20 1544)  sodium chloride 0.9 % bolus 1,000 mL (1,000 mLs Intravenous New Bag/Given 06/15/20 1757)    ED Course  I have  reviewed the triage vital signs and the nursing notes.  Pertinent labs & imaging results that were available during my care of the patient were reviewed by me and considered in my medical decision making (see chart for details).    MDM Rules/Calculators/A&P                          Patient presenting for evaluation of not feeling well in the setting of a Covid infection.  On exam, patient appears nontoxic.  Sats are stable on room air.  Reassuring lung exam.  I personally ambulated the patient around the room, he never desatted.  No signs of respiratory distress or accessory muscle use.  Chest x-ray obtained from triage read interpreted by me, no obvious pneumonia.  Due to his history of diabetes, as well as his reports of feeling overall drained, will obtain labs to ensure no significant electrolyte abnormality.  Will give fluids and reassess.  Labs interpreted by me, overall reassuring.  He has hyperglycemia, but no sign of DKA.  No HHS.  On reassessment after fluids, patient report symptoms are improved.  Vital signs have improved as expected with Tylenol.  I discussed continued symptomatic treatment with patient.  Will contact the monoclonal antibody clinic, as patient will likely meet criteria for treatment.  Discussed importance of close monitoring of respiratory status, prompt return to the ER if he becomes more short of breath.  Pt should qualify for the monoclonal antibody treatment, called for referral.  At this time, patient appears safe for discharge.  Return precautions given.  Patient states he understands  and agrees to plan.   Tyrus Wilms was evaluated in Emergency Department on 06/15/2020 for the symptoms described in the history of present illness. He was evaluated in the context of the global COVID-19 pandemic, which necessitated consideration that the patient might be at risk for infection with the SARS-CoV-2 virus that causes COVID-19. Institutional protocols and algorithms that pertain to the evaluation of patients at risk for COVID-19 are in a state of rapid change based on information released by regulatory bodies including the CDC and federal and state organizations. These policies and algorithms were followed during the patient's care in the ED.  Final Clinical Impression(s) / ED Diagnoses Final diagnoses:  COVID-19  Hyperglycemia    Rx / DC Orders ED Discharge Orders         Ordered    benzonatate (TESSALON) 100 MG capsule  Every 8 hours     Discontinue  Reprint     06/15/20 1907           Alveria Apley, PA-C 06/15/20 1908    Charlynne Pander, MD 06/15/20 2325

## 2020-06-16 ENCOUNTER — Telehealth: Payer: Self-pay | Admitting: Physician Assistant

## 2020-06-16 NOTE — Telephone Encounter (Signed)
Called to discuss with patient about Covid symptoms and the use of bamlanivimab/etesevimab or casirivimab/imdevimab, a monoclonal antibody infusion for those with mild to moderate Covid symptoms and at a high risk of hospitalization.  Pt is qualified for this infusion at the [facility] infusion center due to BMI >25 and DMT2.    Message left to call back our hotline [phone].  Cline Crock PA-C  [facility]

## 2020-12-23 IMAGING — DX DG CHEST 1V PORT
1 series · 1 of 1 positions shown · non-contrast
Comparison: None.

CLINICAL DATA: Shortness of breath, fever, 9CGET-T9 positive.

EXAM:
PORTABLE CHEST 1 VIEW

[chest ap]
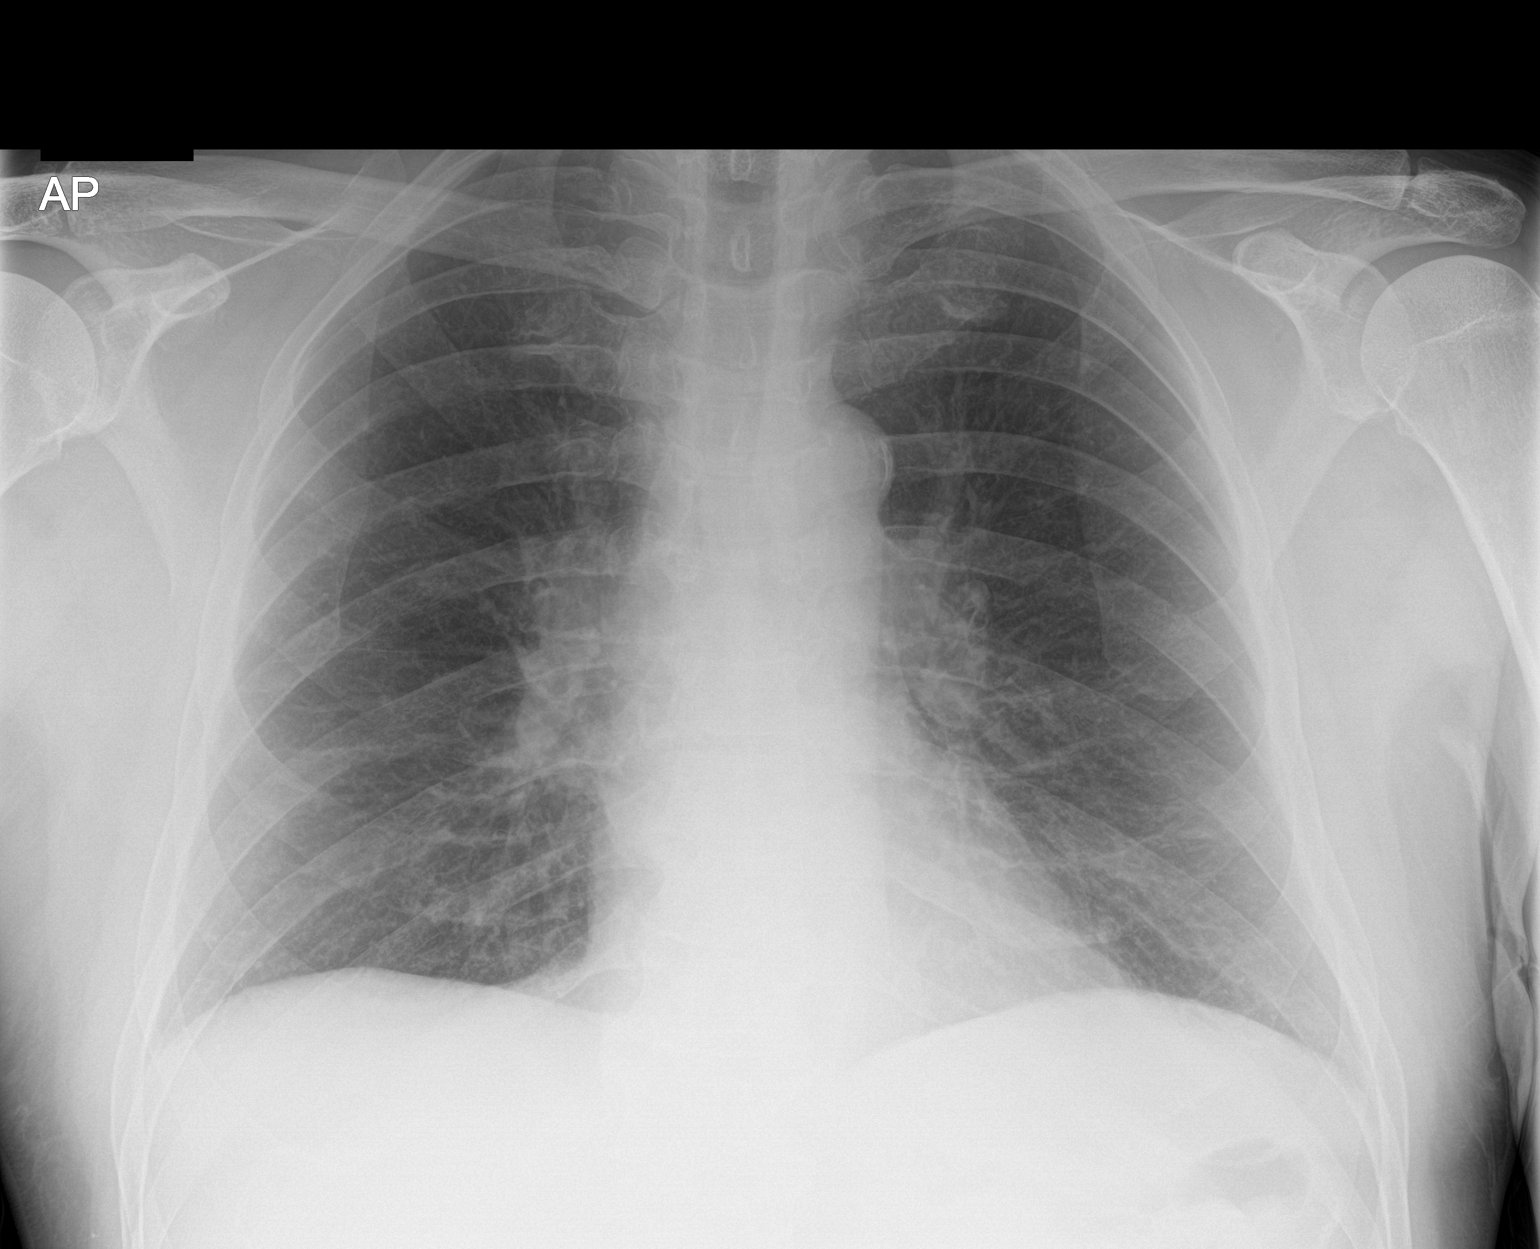

[1 of 1 positions shown; findings below may reference images not displayed]

FINDINGS: The heart size and mediastinal contours are within normal limits.
Both lungs are clear. No pneumothorax or pleural effusion is noted.
The visualized skeletal structures are unremarkable.
IMPRESSION: No active disease.

## 2021-01-27 DIAGNOSIS — E785 Hyperlipidemia, unspecified: Secondary | ICD-10-CM | POA: Diagnosis not present

## 2021-01-27 DIAGNOSIS — E1165 Type 2 diabetes mellitus with hyperglycemia: Secondary | ICD-10-CM | POA: Diagnosis not present

## 2021-01-27 DIAGNOSIS — I10 Essential (primary) hypertension: Secondary | ICD-10-CM | POA: Diagnosis not present

## 2021-01-27 DIAGNOSIS — I2510 Atherosclerotic heart disease of native coronary artery without angina pectoris: Secondary | ICD-10-CM | POA: Diagnosis not present

## 2021-02-21 NOTE — Progress Notes (Signed)
Cardiology Office Note   Date:  02/22/2021   ID:  Johnny Dorsey, DOB 08-12-1961, MRN [id number]  PCP:  Tally Joe, MD    No chief complaint on file.  CAD  Wt Readings from Last 3 Encounters:  02/22/21 206 lb (93.4 kg)  06/15/20 209 lb (94.8 kg)  05/04/14 224 lb (101.6 kg)       History of Present Illness: Johnny Dorsey is a 60 y.o. male  who has had CAD- LAD stent in 2002. At that time, he had musculoskeletal chest pain which prompted a CAD workup. The pain that he had was quite atypical. He actually thinks he was having a musculoskeletal issue at the same time and it was good fortune that his coronary disease was discovered. Elevated LFTs on multiple statins years ago.  Muscle aches with Zetia. Muscle pain in red yeast rice.    He stopped smoking in 2002.   He got COVID in 2021.  Did not get Antibody infusion.  Sugar was high.  A1C was > 11.   Denies : Chest pain. Dizziness. Leg edema. Nitroglycerin use. Orthopnea. Palpitations. Paroxysmal nocturnal dyspnea. Shortness of breath. Syncope.   Walks/jogs 3 miles in 45 minutes. No cardiac sx with these activities.      Past Medical History:  Diagnosis Date  . Allergic rhinitis   . CAD (coronary artery disease)   . DM (diabetes mellitus) (HCC)   . GERD (gastroesophageal reflux disease)   . Hyperlipidemia   . Hypertension     Past Surgical History:  Procedure Laterality Date  . CORONARY STENT PLACEMENT  2002     Current Outpatient Medications  Medication Sig Dispense Refill  . aspirin 325 MG tablet Take 325 mg by mouth daily.    . cholecalciferol (VITAMIN D) 400 UNITS TABS tablet Take 1,000 Units by mouth.    . Coenzyme Q10 (CO Q 10 PO) Take 2 tablets by mouth daily.    . fish oil-omega-3 fatty acids 1000 MG capsule Take 2 g by mouth daily.    Marland Kitchen JARDIANCE 10 MG TABS tablet Take 10 mg by mouth daily.    . metFORMIN (GLUCOPHAGE-XR) 500 MG 24 hr tablet Take 500 mg by mouth daily with breakfast.    .  olmesartan (BENICAR) 20 MG tablet Take 20 mg by mouth daily.    Marland Kitchen omeprazole (PRILOSEC) 10 MG capsule Take 10 mg by mouth daily.    . ONE TOUCH ULTRA TEST test strip 1 each by Other route as needed.      No current facility-administered medications for this visit.    Allergies:   Lisinopril and Zocor [simvastatin]    Social History:  The patient  reports that he has quit smoking. He has never used smokeless tobacco. He reports current alcohol use. He reports that he does not use drugs.   Family History:  The patient's family history includes Heart disease in his father and mother; Hypertension in his mother.    ROS:  Please see the history of present illness.   Otherwise, review of systems are positive for mild orthostatic dizziness.   All other systems are reviewed and negative.    PHYSICAL EXAM: VS:  BP 108/60   Pulse 75   Ht 5\' 10"  (1.778 m)   Wt 206 lb (93.4 kg)   SpO2 96%   BMI 29.56 kg/m  , BMI Body mass index is 29.56 kg/m. GEN: Well nourished, well developed, in no acute distress  HEENT: normal  Neck:  no JVD, carotid bruits, or masses Cardiac: RRR; no murmurs, rubs, or gallops,no edema  Respiratory:  clear to auscultation bilaterally, normal work of breathing GI: soft, nontender, nondistended, + BS MS: no deformity or atrophy  Skin: warm and dry, no rash Neuro:  Strength and sensation are intact Psych: euthymic mood, full affect   EKG:   The ekg ordered today demonstrates NSR, PVC, no ST changes   Recent Labs: 06/15/2020: ALT 36; BUN 17; Creatinine, Ser 1.10; Hemoglobin 15.9; Platelets 180; Potassium 4.0; Sodium 127   Lipid Panel    Component Value Date/Time   CHOL 219 (H) 01/28/2014 1036   TRIG 93.0 01/28/2014 1036   HDL 40.60 01/28/2014 1036   CHOLHDL 5 01/28/2014 1036   VLDL 18.6 01/28/2014 1036   LDLCALC 140 (H) 09/26/2013 0753   LDLDIRECT 161.8 01/28/2014 1036     Other studies Reviewed: Additional studies/ records that were reviewed today with  results demonstrating: .   ASSESSMENT AND PLAN:  1. CAD: PCI in 2002 No angina. Continue aggressive secondary prevention.  Needs healthy diet and regular exercise.  OK to decrease aspirin to 81 mg daily.  2. DM: Olmesartan was started.  Metformin was started as well.  3. Hyperlipidemia: Refer to lipid clinic. Intolerant of statins and Zetia.  LDL 189.  Will see if he can take PCSK9 inhibitor 4. HTN: Decrease olmesartan to 10 mg daily given some orthostatic sx.  ARB beneficial given DM.    Current medicines are reviewed at length with the patient today.  The patient concerns regarding his medicines were addressed.  The following changes have been made:  No change  Labs/ tests ordered today include:  No orders of the defined types were placed in this encounter.   Recommend 150 minutes/week of aerobic exercise Low fat, low carb, high fiber diet recommended  Disposition:   FU in 6 months   Signed, Lance Muss, MD  02/22/2021 3:03 PM    [facility] [address] [place], [place], [place]  [address] Phone: [phone]; Fax: [phone]

## 2021-02-22 ENCOUNTER — Encounter: Payer: Self-pay | Admitting: Interventional Cardiology

## 2021-02-22 ENCOUNTER — Other Ambulatory Visit: Payer: Self-pay

## 2021-02-22 ENCOUNTER — Telehealth: Payer: Self-pay | Admitting: *Deleted

## 2021-02-22 ENCOUNTER — Ambulatory Visit (INDEPENDENT_AMBULATORY_CARE_PROVIDER_SITE_OTHER): Payer: BC Managed Care – PPO | Admitting: Interventional Cardiology

## 2021-02-22 VITALS — BP 108/60 | HR 75 | Ht 70.0 in | Wt 206.0 lb

## 2021-02-22 DIAGNOSIS — E1159 Type 2 diabetes mellitus with other circulatory complications: Secondary | ICD-10-CM | POA: Diagnosis not present

## 2021-02-22 DIAGNOSIS — I10 Essential (primary) hypertension: Secondary | ICD-10-CM

## 2021-02-22 DIAGNOSIS — I25118 Atherosclerotic heart disease of native coronary artery with other forms of angina pectoris: Secondary | ICD-10-CM | POA: Diagnosis not present

## 2021-02-22 DIAGNOSIS — E782 Mixed hyperlipidemia: Secondary | ICD-10-CM

## 2021-02-22 MED ORDER — ASPIRIN EC 81 MG PO TBEC: 81.0000 mg | DELAYED_RELEASE_TABLET | Freq: Every day | ORAL | 3 refills | Status: AC

## 2021-02-22 MED ORDER — OLMESARTAN MEDOXOMIL 20 MG PO TABS: 10.0000 mg | ORAL_TABLET | Freq: Every day | ORAL | 3 refills | Status: AC

## 2021-02-22 NOTE — Patient Instructions (Signed)
Medication Instructions:  Your physician has recommended you make the following change in your medication: Decrease aspirin to 81 mg by mouth daily. Decrease olmesartan to 10 mg daily--this will be half of a 20 mg tablet  *If you need a refill on your cardiac medications before your next appointment, please call your pharmacy*   Lab Work: none If you have labs (blood work) drawn today and your tests are completely normal, you will receive your results only by: Marland Kitchen MyChart Message (if you have MyChart) OR . A paper copy in the mail If you have any lab test that is abnormal or we need to change your treatment, we will call you to review the results.   Testing/Procedures: none   Follow-Up: At [facility], you and your health needs are our priority.  As part of our continuing mission to provide you with exceptional heart care, we have created designated Provider Care Teams.  These Care Teams include your primary Cardiologist (physician) and Advanced Practice Providers (APPs -  Physician Assistants and Nurse Practitioners) who all work together to provide you with the care you need, when you need it.  We recommend signing up for the patient portal called "MyChart".  Sign up information is provided on this After Visit Summary.  MyChart is used to connect with patients for Virtual Visits (Telemedicine).  Patients are able to view lab/test results, encounter notes, upcoming appointments, etc.  Non-urgent messages can be sent to your provider as well.   To learn more about what you can do with MyChart, go to [email or web address].    Your next appointment:    08/29/21 at 8:00  The format for your next appointment:   In Person  Provider:   Dr Eldridge Dace   Other Instructions

## 2021-02-22 NOTE — Telephone Encounter (Signed)
-----   Message from Awilda Metro, RPH-CPP sent at 02/22/2021  3:42 PM EDT ----- Yes he's a candidate for PCSK9i therapy. Dennie Bible would you be able to help get pt scheduled for appt in lipid clinic to discuss?  Thanks Megan ----- Message ----- From: Corky Crafts, MD Sent: 02/22/2021   3:28 PM EDT To: Awilda Metro, RPH-CPP  ?PCSK9 inhibitor?  He has been intolerant of statins and Zetia.

## 2021-02-22 NOTE — Telephone Encounter (Signed)
I spoke with patient and scheduled appointment in lipid clinic on April 14,2022 at 4:00.

## 2021-02-24 DIAGNOSIS — E1165 Type 2 diabetes mellitus with hyperglycemia: Secondary | ICD-10-CM | POA: Diagnosis not present

## 2021-02-24 DIAGNOSIS — E785 Hyperlipidemia, unspecified: Secondary | ICD-10-CM | POA: Diagnosis not present

## 2021-02-24 DIAGNOSIS — I2510 Atherosclerotic heart disease of native coronary artery without angina pectoris: Secondary | ICD-10-CM | POA: Diagnosis not present

## 2021-02-24 DIAGNOSIS — I10 Essential (primary) hypertension: Secondary | ICD-10-CM | POA: Diagnosis not present

## 2021-03-15 DIAGNOSIS — E119 Type 2 diabetes mellitus without complications: Secondary | ICD-10-CM | POA: Diagnosis not present

## 2021-03-15 DIAGNOSIS — H16223 Keratoconjunctivitis sicca, not specified as Sjogren's, bilateral: Secondary | ICD-10-CM | POA: Diagnosis not present

## 2021-03-15 DIAGNOSIS — H2512 Age-related nuclear cataract, left eye: Secondary | ICD-10-CM | POA: Diagnosis not present

## 2021-03-15 DIAGNOSIS — H40013 Open angle with borderline findings, low risk, bilateral: Secondary | ICD-10-CM | POA: Diagnosis not present

## 2021-03-17 ENCOUNTER — Ambulatory Visit (INDEPENDENT_AMBULATORY_CARE_PROVIDER_SITE_OTHER): Payer: BC Managed Care – PPO | Admitting: Pharmacist

## 2021-03-17 ENCOUNTER — Other Ambulatory Visit: Payer: Self-pay

## 2021-03-17 ENCOUNTER — Encounter: Payer: Self-pay | Admitting: Pharmacist

## 2021-03-17 DIAGNOSIS — E782 Mixed hyperlipidemia: Secondary | ICD-10-CM | POA: Diagnosis not present

## 2021-03-17 DIAGNOSIS — J309 Allergic rhinitis, unspecified: Secondary | ICD-10-CM | POA: Insufficient documentation

## 2021-03-17 DIAGNOSIS — I2510 Atherosclerotic heart disease of native coronary artery without angina pectoris: Secondary | ICD-10-CM

## 2021-03-17 DIAGNOSIS — Z1211 Encounter for screening for malignant neoplasm of colon: Secondary | ICD-10-CM | POA: Insufficient documentation

## 2021-03-17 DIAGNOSIS — T466X5A Adverse effect of antihyperlipidemic and antiarteriosclerotic drugs, initial encounter: Secondary | ICD-10-CM

## 2021-03-17 DIAGNOSIS — E1165 Type 2 diabetes mellitus with hyperglycemia: Secondary | ICD-10-CM | POA: Insufficient documentation

## 2021-03-17 DIAGNOSIS — K219 Gastro-esophageal reflux disease without esophagitis: Secondary | ICD-10-CM | POA: Insufficient documentation

## 2021-03-17 DIAGNOSIS — M791 Myalgia, unspecified site: Secondary | ICD-10-CM | POA: Diagnosis not present

## 2021-03-17 MED ORDER — REPATHA SURECLICK 140 MG/ML ~~LOC~~ SOAJ: 1.0000 mL | SUBCUTANEOUS | 11 refills | Status: DC

## 2021-03-17 NOTE — Progress Notes (Signed)
Patient ID: Johnny Dorsey                 DOB: 07/21/1961                    MRN: [id number]     HPI: Johnny Dorsey is a 60 y.o. male patient referred to lipid clinic by Dr Eldridge Dace. PMH is significant for CAD, HTN, and T2DM.  Last A1c 12.2. Patient last seen by Dr Eldridge Dace on 02/22/21 and referred to lipid clinic after reporting muscle pain to Zetia.  Previous had sever myalgias with statins.  Patient presents today in good spirits.  At Last PCP visit on 01/27/21 patient's A1c was 12.2 and was symptomatic.  Reported blurry vision and foot pain and urinating at least 3 times a night.  Began a diet and exercise regimen.  Has cut down on junk foods and is exercising at least 3 miles a night and reports it is mostly uphill.  Has lost weight and reports his fasting blood sugars are now between 130-160.    Has trialed multiple statins before including simvastatin and rosuvastatin and both caused leg pains.  Recently started on Zetia and had the same myalgias.  Reports a strong family history of both HLD and DM.  Smoked 2-3 packs a day for 30 years but quit in 2002.   Patient reports he is on a high deductible prescription drug plan so while he makes decent money, brand name medications are expensive for him.     Current Medications: n/a Intolerances: Zetia, simvastatin, rosuvastatin Risk Factors: DM, LDL goal: <55  Exercise: Has begun walking/jogging 3 miles an evening after sinner  Social History: Quit smoking in 2002  Labs: TC 264, LDL 189, Trigs 157, HDL 47 (01/27/21 - not on any lipid lowering drugs.  A1c 12.2  Past Medical History:  Diagnosis Date  . Allergic rhinitis   . CAD (coronary artery disease)   . DM (diabetes mellitus) (HCC)   . GERD (gastroesophageal reflux disease)   . Hyperlipidemia   . Hypertension     Current Outpatient Medications on File Prior to Visit  Medication Sig Dispense Refill  . aspirin EC 81 MG tablet Take 1 tablet (81 mg total) by mouth daily. Swallow whole.  90 tablet 3  . cholecalciferol (VITAMIN D) 400 UNITS TABS tablet Take 1,000 Units by mouth.    . Coenzyme Q10 (CO Q 10 PO) Take 2 tablets by mouth daily.    . fish oil-omega-3 fatty acids 1000 MG capsule Take 2 g by mouth daily.    Marland Kitchen JARDIANCE 10 MG TABS tablet Take 10 mg by mouth daily.    . metFORMIN (GLUCOPHAGE-XR) 500 MG 24 hr tablet Take 500 mg by mouth daily with breakfast.    . olmesartan (BENICAR) 20 MG tablet Take 0.5 tablets (10 mg total) by mouth daily. 45 tablet 3  . omeprazole (PRILOSEC) 10 MG capsule Take 10 mg by mouth daily.    . ONE TOUCH ULTRA TEST test strip 1 each by Other route as needed.      No current facility-administered medications on file prior to visit.    Allergies  Allergen Reactions  . Lisinopril     ED dry cough  . Zocor [Simvastatin] Other (See Comments)    Leg pain and liver function increased    Assessment/Plan:  1. Hyperlipidemia - Patient most recent LDL 189 which is above goal of <55.  Aggressive goal chosen due to history of DM, CAD,  and smoking history.  Unfortunately with patient's intolerance to statins and Zetia, achieving goal will be difficult.    Patient has made significant lifestyle changes with regards to diet and exercise.  Encouraged patient to continue exercise regimen as this will help reduce LDL and increase HDL.  Patient's next step would be PCSK9i.  Using demo pen, educated patient on storage, site selection and administration.  Patient was able to demonstrate in room.  Will complete PA and contact patient.  Will be able to use copay card but patient may still have a high dedecutible and likely earns too much for grant program.  Will contact patient after PA is approved and will check copay.  Laural Golden, PharmD, BCACP, CDCES, CPP [facility] [address] [place], [place] [address] Phone: [phone]; Fax: [phone] 03/17/2021 4:57 PM

## 2021-03-17 NOTE — Patient Instructions (Signed)
It was nice meeting you today!  Continue your diet changes.  Avoid simple sugars and sweets.  Try to concentrate on vegetables, lean proteins, and whole grains.  Continue your exercise regimen.  At least 30 minutes a day 5 days a week  We will start a new medication for your cholesterol you will inject once every 2 weeks.  We will check your cholesterol about 2-3 months after you start the medication.  I will call you when it is approved and we can see what the copay is  Laural Golden, PharmD, BCACP, CDCES, CPP [facility] [address], [place], [place] [address] Phone: [phone]; Fax: [phone] 03/17/2021 4:27 PM

## 2021-03-30 DIAGNOSIS — I2510 Atherosclerotic heart disease of native coronary artery without angina pectoris: Secondary | ICD-10-CM | POA: Diagnosis not present

## 2021-03-30 DIAGNOSIS — I10 Essential (primary) hypertension: Secondary | ICD-10-CM | POA: Diagnosis not present

## 2021-03-30 DIAGNOSIS — E785 Hyperlipidemia, unspecified: Secondary | ICD-10-CM | POA: Diagnosis not present

## 2021-03-30 DIAGNOSIS — E1165 Type 2 diabetes mellitus with hyperglycemia: Secondary | ICD-10-CM | POA: Diagnosis not present

## 2021-06-01 DIAGNOSIS — I10 Essential (primary) hypertension: Secondary | ICD-10-CM | POA: Diagnosis not present

## 2021-06-01 DIAGNOSIS — I2510 Atherosclerotic heart disease of native coronary artery without angina pectoris: Secondary | ICD-10-CM | POA: Diagnosis not present

## 2021-06-01 DIAGNOSIS — E785 Hyperlipidemia, unspecified: Secondary | ICD-10-CM | POA: Diagnosis not present

## 2021-06-01 DIAGNOSIS — E1165 Type 2 diabetes mellitus with hyperglycemia: Secondary | ICD-10-CM | POA: Diagnosis not present

## 2021-08-28 NOTE — Progress Notes (Signed)
Cardiology Office Note   Date:  08/29/2021   ID:  Johnny Dorsey, DOB 06/29/1961, MRN [id number]  PCP:  Johnny Lund, PA    No chief complaint on file.  CAD  Wt Readings from Last 3 Encounters:  08/29/21 208 lb 3.2 oz (94.4 kg)  02/22/21 206 lb (93.4 kg)  06/15/20 209 lb (94.8 kg)       History of Present Illness: Johnny Dorsey is a 60 y.o. male  who has had CAD- LAD stent in 2002. At that time, he had musculoskeletal chest pain which prompted a CAD workup. The pain that he had was quite atypical. He actually thinks he was having a musculoskeletal issue at the same time and it was good fortune that his coronary disease was discovered. Elevated LFTs on multiple statins years ago.  Muscle aches with Zetia. Muscle pain in red yeast rice.     He stopped smoking in 2002.    He got COVID in 2021.  Did not get Antibody infusion.  Sugar was high.  A1C was > 11.   ASA decreased to 81 mg in 2021.  Repatha was too expensive as well as jardiance.  LDL was 170 in 05/2021.   Denies : Chest pain. Dizziness. Leg edema. Nitroglycerin use. Orthopnea. Palpitations. Paroxysmal nocturnal dyspnea. Shortness of breath. Syncope.    Had some knee pain with longer walking. THis has limited walking recently, in the past few months.  Past Medical History:  Diagnosis Date   Allergic rhinitis    CAD (coronary artery disease)    DM (diabetes mellitus) (HCC)    GERD (gastroesophageal reflux disease)    Hyperlipidemia    Hypertension     Past Surgical History:  Procedure Laterality Date   CORONARY STENT PLACEMENT  2002     Current Outpatient Medications  Medication Sig Dispense Refill   aspirin EC 81 MG tablet Take 1 tablet (81 mg total) by mouth daily. Swallow whole. 90 tablet 3   cholecalciferol (VITAMIN D) 400 UNITS TABS tablet Take 1,000 Units by mouth.     Coenzyme Q10 (CO Q 10 PO) Take 2 tablets by mouth daily.     fish oil-omega-3 fatty acids 1000 MG capsule Take 2 g by mouth  daily.     metFORMIN (GLUCOPHAGE-XR) 500 MG 24 hr tablet Take 1,000 mg by mouth in the morning and at bedtime.     olmesartan (BENICAR) 20 MG tablet Take 0.5 tablets (10 mg total) by mouth daily. 45 tablet 3   omeprazole (PRILOSEC) 10 MG capsule Take 10 mg by mouth daily. As needed     ONE TOUCH ULTRA TEST test strip 1 each by Other route as needed.      Evolocumab (REPATHA SURECLICK) 140 MG/ML SOAJ Inject 1 mL into the skin every 14 (fourteen) days. (Patient not taking: Reported on 08/29/2021) 2 mL 11   JARDIANCE 10 MG TABS tablet Take 10 mg by mouth daily. (Patient not taking: Reported on 08/29/2021)     No current facility-administered medications for this visit.    Allergies:   Lisinopril, Zocor [simvastatin], and Ezetimibe    Social History:  The patient  reports that he has quit smoking. He has never used smokeless tobacco. He reports current alcohol use. He reports that he does not use drugs.   Family History:  The patient's family history includes Heart disease in his father and mother; Hypertension in his mother.    ROS:  Please see the history of present  illness.   Otherwise, review of systems are positive for difficulty maintaining weight loss.   All other systems are reviewed and negative.    PHYSICAL EXAM: VS:  BP 116/80   Pulse 80   Ht 5\' 10"  (1.778 m)   Wt 208 lb 3.2 oz (94.4 kg)   SpO2 97%   BMI 29.87 kg/m  , BMI Body mass index is 29.87 kg/m. GEN: Well nourished, well developed, in no acute distress HEENT: normal Neck: no JVD, carotid bruits, or masses Cardiac: RRR; no murmurs, rubs, or gallops,no edema  Respiratory:  clear to auscultation bilaterally, normal work of breathing GI: soft, nontender, nondistended, + BS MS: no deformity or atrophy Skin: warm and dry, no rash Neuro:  Strength and sensation are intact Psych: euthymic mood, full affect   EKG:   The ekg ordered in 02/2021 demonstrates NSR, PVC   Recent Labs: No results found for requested labs  within last 8760 hours.   Lipid Panel    Component Value Date/Time   CHOL 219 (H) 01/28/2014 1036   TRIG 93.0 01/28/2014 1036   HDL 40.60 01/28/2014 1036   CHOLHDL 5 01/28/2014 1036   VLDL 18.6 01/28/2014 1036   LDLCALC 140 (H) 09/26/2013 0753   LDLDIRECT 161.8 01/28/2014 1036     Other studies Reviewed: Additional studies/ records that were reviewed today with results demonstrating: labs reviewed; negative stress test in 2015.   ASSESSMENT AND PLAN:  CAD: No angina on medical therapy. Continue aggressive secondary prevention.  Negative ETT in 2015. Low threshold for stress test if he had any sx or change in exercise tolerance.  DM: A1C 7.0.  Avoid concentrated sweets.  Avoid processed foods.  Increase fiber intake.  A1C Significantly improved from 12.  Hyperlipidemia: LDL 170. Whole food, plant based diet.  Lost weight for DM but gained some back.  Repatha too expensive with high deductible health plan.  Will check again with PharmD for assistance.  HTN: Has had orthostatic sx with higher doses of olmesartan. BP at home home has improved.  No orthostatic sx on current dose.  Home BPs not checked frequently, but in the normal range when checked.  Quit smoking in 2002.    Current medicines are reviewed at length with the patient today.  The patient concerns regarding his medicines were addressed.  The following changes have been made:  No change  Labs/ tests ordered today include:  No orders of the defined types were placed in this encounter.   Recommend 150 minutes/week of aerobic exercise Low fat, low carb, high fiber diet recommended  Disposition:   FU in 1 year   Signed, 2003, MD  08/29/2021 8:12 AM    [facility] [address] [place], [place], [place]  [place] Phone: [phone]; Fax: [phone]

## 2021-08-29 ENCOUNTER — Ambulatory Visit (INDEPENDENT_AMBULATORY_CARE_PROVIDER_SITE_OTHER): Payer: BC Managed Care – PPO | Admitting: Interventional Cardiology

## 2021-08-29 ENCOUNTER — Encounter: Payer: Self-pay | Admitting: Interventional Cardiology

## 2021-08-29 ENCOUNTER — Other Ambulatory Visit: Payer: Self-pay

## 2021-08-29 VITALS — BP 116/80 | HR 80 | Ht 70.0 in | Wt 208.2 lb

## 2021-08-29 DIAGNOSIS — E1159 Type 2 diabetes mellitus with other circulatory complications: Secondary | ICD-10-CM | POA: Diagnosis not present

## 2021-08-29 DIAGNOSIS — I25118 Atherosclerotic heart disease of native coronary artery with other forms of angina pectoris: Secondary | ICD-10-CM

## 2021-08-29 DIAGNOSIS — I10 Essential (primary) hypertension: Secondary | ICD-10-CM

## 2021-08-29 DIAGNOSIS — E782 Mixed hyperlipidemia: Secondary | ICD-10-CM

## 2021-08-29 NOTE — Patient Instructions (Signed)
Medication Instructions:  Your physician recommends that you continue on your current medications as directed. Please refer to the Current Medication list given to you today.  *If you need a refill on your cardiac medications before your next appointment, please call your pharmacy*   Lab Work: none If you have labs (blood work) drawn today and your tests are completely normal, you will receive your results only by: MyChart Message (if you have MyChart) OR A paper copy in the mail If you have any lab test that is abnormal or we need to change your treatment, we will call you to review the results.   Testing/Procedures: none   Follow-Up: At [facility], you and your health needs are our priority.  As part of our continuing mission to provide you with exceptional heart care, we have created designated Provider Care Teams.  These Care Teams include your primary Cardiologist (physician) and Advanced Practice Providers (APPs -  Physician Assistants and Nurse Practitioners) who all work together to provide you with the care you need, when you need it.  We recommend signing up for the patient portal called "MyChart".  Sign up information is provided on this After Visit Summary.  MyChart is used to connect with patients for Virtual Visits (Telemedicine).  Patients are able to view lab/test results, encounter notes, upcoming appointments, etc.  Non-urgent messages can be sent to your provider as well.   To learn more about what you can do with MyChart, go to [email or web address].    Your next appointment:   12 month(s)  The format for your next appointment:   In Person  Provider:   You may see Jayadeep Varanasi, MD or one of the following Advanced Practice Providers on your designated Care Team:   Dayna Dunn, PA-C Michele Lenze, PA-C   Other Instructions High-Fiber Eating Plan Fiber, also called dietary fiber, is a type of carbohydrate. It is found foods such as fruits, vegetables,  whole grains, and beans. A high-fiber diet can have many health benefits. Your health care provider may recommend a high-fiber diet to help: Prevent constipation. Fiber can make your bowel movements more regular. Lower your cholesterol. Relieve the following conditions: Inflammation of veins in the anus (hemorrhoids). Inflammation of specific areas of the digestive tract (uncomplicated diverticulosis). A problem of the large intestine, also called the colon, that sometimes causes pain and diarrhea (irritable bowel syndrome, or IBS). Prevent overeating as part of a weight-loss plan. Prevent heart disease, type 2 diabetes, and certain cancers. What are tips for following this plan? Reading food labels  Check the nutrition facts label on food products for the amount of dietary fiber. Choose foods that have 5 grams of fiber or more per serving. The goals for recommended daily fiber intake include: Men (age 50 or younger): 34-38 g. Men (over age 50): 28-34 g. Women (age 50 or younger): 25-28 g. Women (over age 50): 22-25 g. Your daily fiber goal is _____________ g. Shopping Choose whole fruits and vegetables instead of processed forms, such as apple juice or applesauce. Choose a wide variety of high-fiber foods such as avocados, lentils, oats, and kidney beans. Read the nutrition facts label of the foods you choose. Be aware of foods with added fiber. These foods often have high sugar and sodium amounts per serving. Cooking Use whole-grain flour for baking and cooking. Cook with brown rice instead of white rice. Meal planning Start the day with a breakfast that is high in fiber, such as a cereal that   contains 5 g of fiber or more per serving. Eat breads and cereals that are made with whole-grain flour instead of refined flour or white flour. Eat brown rice, bulgur wheat, or millet instead of white rice. Use beans in place of meat in soups, salads, and pasta dishes. Be sure that half of the  grains you eat each day are whole grains. General information You can get the recommended daily intake of dietary fiber by: Eating a variety of fruits, vegetables, grains, nuts, and beans. Taking a fiber supplement if you are not able to take in enough fiber in your diet. It is better to get fiber through food than from a supplement. Gradually increase how much fiber you consume. If you increase your intake of dietary fiber too quickly, you may have bloating, cramping, or gas. Drink plenty of water to help you digest fiber. Choose high-fiber snacks, such as berries, raw vegetables, nuts, and popcorn. What foods should I eat? Fruits Berries. Pears. Apples. Oranges. Avocado. Prunes and raisins. Dried figs. Vegetables Sweet potatoes. Spinach. Kale. Artichokes. Cabbage. Broccoli. Cauliflower.Green peas. Carrots. Squash. Grains Whole-grain breads. Multigrain cereal. Oats and oatmeal. Brown rice. Barley.Bulgur wheat. Millet. Quinoa. Bran muffins. Popcorn. Rye wafer crackers. Meats and other proteins Navy beans, kidney beans, and pinto beans. Soybeans. Split peas. Lentils. Nutsand seeds. Dairy Fiber-fortified yogurt. Beverages Fiber-fortified soy milk. Fiber-fortified orange juice. Other foods Fiber bars. The items listed above may not be a complete list of recommended foods and beverages. Contact a dietitian for more information. What foods should I avoid? Fruits Fruit juice. Cooked, strained fruit. Vegetables Fried potatoes. Canned vegetables. Well-cooked vegetables. Grains White bread. Pasta made with refined flour. White rice. Meats and other proteins Fatty cuts of meat. Fried chicken or fried fish. Dairy Milk. Yogurt. Cream cheese. Sour cream. Fats and oils Butters. Beverages Soft drinks. Other foods Cakes and pastries. The items listed above may not be a complete list of foods and beverages to avoid. Talk with your dietitian about what choices are best for you. Summary Fiber  is a type of carbohydrate. It is found in foods such as fruits, vegetables, whole grains, and beans. A high-fiber diet has many benefits. It can help to prevent constipation, lower blood cholesterol, aid weight loss, and reduce your risk of heart disease, diabetes, and certain cancers. Increase your intake of fiber gradually. Increasing fiber too quickly may cause cramping, bloating, and gas. Drink plenty of water while you increase the amount of fiber you consume. The best sources of fiber include whole fruits and vegetables, whole grains, nuts, seeds, and beans. This information is not intended to replace advice given to you by your health care provider. Make sure you discuss any questions you have with your healthcare provider. Document Revised: 03/25/2020 Document Reviewed: 03/25/2020 Elsevier Patient Education  2022 Elsevier Inc.   

## 2021-09-01 ENCOUNTER — Telehealth: Payer: Self-pay

## 2021-09-01 DIAGNOSIS — E782 Mixed hyperlipidemia: Secondary | ICD-10-CM

## 2021-09-01 DIAGNOSIS — T466X5A Adverse effect of antihyperlipidemic and antiarteriosclerotic drugs, initial encounter: Secondary | ICD-10-CM

## 2021-09-01 DIAGNOSIS — I2510 Atherosclerotic heart disease of native coronary artery without angina pectoris: Secondary | ICD-10-CM

## 2021-09-01 MED ORDER — REPATHA SURECLICK 140 MG/ML ~~LOC~~ SOAJ: 1.0000 mL | SUBCUTANEOUS | 11 refills | Status: AC

## 2021-09-01 NOTE — Telephone Encounter (Signed)
CALLED AND gave repatha copay card to THE PT AS FOLLOWS: RxBin: [id number] RxPCN: [id number] RxGrp: [id number] ID: [id number]  Refill sent to pharmacy

## 2021-09-01 NOTE — Telephone Encounter (Signed)
-----   Message from Cheree Ditto, [facility] sent at 08/31/2021  3:32 PM EDT ----- Can you see if you can get him a copay card for this?  ----- Message ----- From: Corky Crafts, MD Sent: 08/29/2021   8:55 AM EDT To: Cheree Ditto, RPH  Any other options for lipids.  Repatha and jardiance too expensive.

## 2021-09-08 DIAGNOSIS — E785 Hyperlipidemia, unspecified: Secondary | ICD-10-CM | POA: Diagnosis not present

## 2021-09-08 DIAGNOSIS — I2510 Atherosclerotic heart disease of native coronary artery without angina pectoris: Secondary | ICD-10-CM | POA: Diagnosis not present

## 2021-09-08 DIAGNOSIS — E1165 Type 2 diabetes mellitus with hyperglycemia: Secondary | ICD-10-CM | POA: Diagnosis not present

## 2021-09-08 DIAGNOSIS — I10 Essential (primary) hypertension: Secondary | ICD-10-CM | POA: Diagnosis not present

## 2021-10-14 DIAGNOSIS — L03011 Cellulitis of right finger: Secondary | ICD-10-CM | POA: Diagnosis not present

## 2021-12-09 DIAGNOSIS — E785 Hyperlipidemia, unspecified: Secondary | ICD-10-CM | POA: Diagnosis not present

## 2022-01-19 DIAGNOSIS — T485X5A Adverse effect of other anti-common-cold drugs, initial encounter: Secondary | ICD-10-CM | POA: Diagnosis not present

## 2022-01-19 DIAGNOSIS — J310 Chronic rhinitis: Secondary | ICD-10-CM | POA: Diagnosis not present

## 2022-02-13 DIAGNOSIS — R0981 Nasal congestion: Secondary | ICD-10-CM | POA: Diagnosis not present

## 2022-03-27 DIAGNOSIS — R509 Fever, unspecified: Secondary | ICD-10-CM | POA: Diagnosis not present

## 2022-03-27 DIAGNOSIS — J029 Acute pharyngitis, unspecified: Secondary | ICD-10-CM | POA: Diagnosis not present

## 2022-03-27 DIAGNOSIS — R051 Acute cough: Secondary | ICD-10-CM | POA: Diagnosis not present

## 2022-03-27 DIAGNOSIS — J069 Acute upper respiratory infection, unspecified: Secondary | ICD-10-CM | POA: Diagnosis not present

## 2022-03-27 DIAGNOSIS — Z03818 Encounter for observation for suspected exposure to other biological agents ruled out: Secondary | ICD-10-CM | POA: Diagnosis not present

## 2022-04-05 DIAGNOSIS — Z0000 Encounter for general adult medical examination without abnormal findings: Secondary | ICD-10-CM | POA: Diagnosis not present

## 2022-04-05 DIAGNOSIS — Z23 Encounter for immunization: Secondary | ICD-10-CM | POA: Diagnosis not present

## 2022-04-05 DIAGNOSIS — E1169 Type 2 diabetes mellitus with other specified complication: Secondary | ICD-10-CM | POA: Diagnosis not present

## 2022-04-05 DIAGNOSIS — Z1159 Encounter for screening for other viral diseases: Secondary | ICD-10-CM | POA: Diagnosis not present

## 2022-04-05 DIAGNOSIS — I2510 Atherosclerotic heart disease of native coronary artery without angina pectoris: Secondary | ICD-10-CM | POA: Diagnosis not present

## 2022-04-05 DIAGNOSIS — Z125 Encounter for screening for malignant neoplasm of prostate: Secondary | ICD-10-CM | POA: Diagnosis not present

## 2022-04-05 DIAGNOSIS — I10 Essential (primary) hypertension: Secondary | ICD-10-CM | POA: Diagnosis not present

## 2022-04-05 DIAGNOSIS — E785 Hyperlipidemia, unspecified: Secondary | ICD-10-CM | POA: Diagnosis not present

## 2022-06-14 DIAGNOSIS — Z947 Corneal transplant status: Secondary | ICD-10-CM | POA: Diagnosis not present

## 2022-06-14 DIAGNOSIS — E119 Type 2 diabetes mellitus without complications: Secondary | ICD-10-CM | POA: Diagnosis not present

## 2022-06-14 DIAGNOSIS — H2512 Age-related nuclear cataract, left eye: Secondary | ICD-10-CM | POA: Diagnosis not present

## 2022-06-14 DIAGNOSIS — H40013 Open angle with borderline findings, low risk, bilateral: Secondary | ICD-10-CM | POA: Diagnosis not present

## 2022-10-18 DIAGNOSIS — E1169 Type 2 diabetes mellitus with other specified complication: Secondary | ICD-10-CM | POA: Diagnosis not present

## 2022-10-18 DIAGNOSIS — E782 Mixed hyperlipidemia: Secondary | ICD-10-CM | POA: Diagnosis not present

## 2022-10-18 DIAGNOSIS — Z23 Encounter for immunization: Secondary | ICD-10-CM | POA: Diagnosis not present

## 2022-10-18 DIAGNOSIS — I2510 Atherosclerotic heart disease of native coronary artery without angina pectoris: Secondary | ICD-10-CM | POA: Diagnosis not present

## 2022-10-18 DIAGNOSIS — E785 Hyperlipidemia, unspecified: Secondary | ICD-10-CM | POA: Diagnosis not present

## 2022-10-18 DIAGNOSIS — I10 Essential (primary) hypertension: Secondary | ICD-10-CM | POA: Diagnosis not present

## 2022-12-26 ENCOUNTER — Other Ambulatory Visit (HOSPITAL_COMMUNITY): Payer: Self-pay

## 2023-02-13 DIAGNOSIS — L570 Actinic keratosis: Secondary | ICD-10-CM | POA: Diagnosis not present

## 2023-02-13 DIAGNOSIS — L538 Other specified erythematous conditions: Secondary | ICD-10-CM | POA: Diagnosis not present

## 2023-02-13 DIAGNOSIS — L298 Other pruritus: Secondary | ICD-10-CM | POA: Diagnosis not present

## 2023-02-13 DIAGNOSIS — L218 Other seborrheic dermatitis: Secondary | ICD-10-CM | POA: Diagnosis not present

## 2023-04-19 DIAGNOSIS — E1169 Type 2 diabetes mellitus with other specified complication: Secondary | ICD-10-CM | POA: Diagnosis not present

## 2023-04-19 DIAGNOSIS — N529 Male erectile dysfunction, unspecified: Secondary | ICD-10-CM | POA: Diagnosis not present

## 2023-04-19 DIAGNOSIS — I10 Essential (primary) hypertension: Secondary | ICD-10-CM | POA: Diagnosis not present

## 2023-04-19 DIAGNOSIS — Z125 Encounter for screening for malignant neoplasm of prostate: Secondary | ICD-10-CM | POA: Diagnosis not present

## 2023-04-19 DIAGNOSIS — Z0000 Encounter for general adult medical examination without abnormal findings: Secondary | ICD-10-CM | POA: Diagnosis not present

## 2023-04-19 DIAGNOSIS — E785 Hyperlipidemia, unspecified: Secondary | ICD-10-CM | POA: Diagnosis not present

## 2023-06-21 DIAGNOSIS — L821 Other seborrheic keratosis: Secondary | ICD-10-CM | POA: Diagnosis not present

## 2023-06-21 DIAGNOSIS — L814 Other melanin hyperpigmentation: Secondary | ICD-10-CM | POA: Diagnosis not present

## 2023-06-21 DIAGNOSIS — L218 Other seborrheic dermatitis: Secondary | ICD-10-CM | POA: Diagnosis not present

## 2023-06-21 DIAGNOSIS — L570 Actinic keratosis: Secondary | ICD-10-CM | POA: Diagnosis not present

## 2023-06-21 DIAGNOSIS — D225 Melanocytic nevi of trunk: Secondary | ICD-10-CM | POA: Diagnosis not present

## 2023-07-09 DIAGNOSIS — E119 Type 2 diabetes mellitus without complications: Secondary | ICD-10-CM | POA: Diagnosis not present

## 2023-07-09 DIAGNOSIS — H2512 Age-related nuclear cataract, left eye: Secondary | ICD-10-CM | POA: Diagnosis not present

## 2023-07-09 DIAGNOSIS — Z947 Corneal transplant status: Secondary | ICD-10-CM | POA: Diagnosis not present

## 2023-07-09 DIAGNOSIS — H40013 Open angle with borderline findings, low risk, bilateral: Secondary | ICD-10-CM | POA: Diagnosis not present

## 2023-10-24 DIAGNOSIS — I2510 Atherosclerotic heart disease of native coronary artery without angina pectoris: Secondary | ICD-10-CM | POA: Diagnosis not present

## 2023-10-24 DIAGNOSIS — R197 Diarrhea, unspecified: Secondary | ICD-10-CM | POA: Diagnosis not present

## 2023-10-24 DIAGNOSIS — E1169 Type 2 diabetes mellitus with other specified complication: Secondary | ICD-10-CM | POA: Diagnosis not present

## 2023-10-24 DIAGNOSIS — I10 Essential (primary) hypertension: Secondary | ICD-10-CM | POA: Diagnosis not present

## 2023-10-24 DIAGNOSIS — E785 Hyperlipidemia, unspecified: Secondary | ICD-10-CM | POA: Diagnosis not present

## 2023-11-04 DIAGNOSIS — R197 Diarrhea, unspecified: Secondary | ICD-10-CM | POA: Diagnosis not present

## 2023-11-26 DIAGNOSIS — R197 Diarrhea, unspecified: Secondary | ICD-10-CM | POA: Diagnosis not present

## 2023-12-06 DIAGNOSIS — R197 Diarrhea, unspecified: Secondary | ICD-10-CM | POA: Diagnosis not present

## 2024-01-21 NOTE — Progress Notes (Signed)
 Cardiology Office Note   Date:  01/30/2024   ID:  Johnny Dorsey, DOB [birth year], MRN [id number]  PCP:  Wilfrid Lund, PA     CAD  Wt Readings from Last 3 Encounters:  01/30/24 211 lb (95.7 kg)  08/29/21 208 lb 3.2 oz (94.4 kg)  02/22/21 206 lb (93.4 kg)       History of Present Illness: Johnny Dorsey is a 63 y.o. male  new patient to me. Last seen by Dr Eldridge Dace in 2022 History of CAD- LAD stent in 2002. At that time, he had musculoskeletal chest pain which prompted a CAD workup. The pain that he had was quite atypical. He actually thinks he was having a musculoskeletal issue at the same   Intolerant to statins and Zetia with elevated LFTls and myalgias    He stopped smoking in 2002.    ASA decreased to 81 mg in 2021.  Repatha was too expensive as well as jardiance.  LDL was 170 in 05/2021.   Denies : Chest pain. Dizziness. Leg edema. Nitroglycerin use. Orthopnea. Palpitations. Paroxysmal nocturnal dyspnea. Shortness of breath. Syncope.    Does Holiday representative. Married with 4 local kids and 9 grand children Rides motorcycles Has epi penn for bee stings Needs to see ENT for nasal congestion ? Devicated septum  Past Medical History:  Diagnosis Date   Allergic rhinitis    CAD (coronary artery disease)    DM (diabetes mellitus) (HCC)    GERD (gastroesophageal reflux disease)    Hyperlipidemia    Hypertension     Past Surgical History:  Procedure Laterality Date   CORONARY STENT PLACEMENT  2002     Current Outpatient Medications  Medication Sig Dispense Refill   EPINEPHrine 0.3 mg/0.3 mL IJ SOAJ injection Inject 0.3 mg into the muscle as needed.     aspirin EC 81 MG tablet Take 1 tablet (81 mg total) by mouth daily. Swallow whole. 90 tablet 3   cholecalciferol (VITAMIN D) 400 UNITS TABS tablet Take 1,000 Units by mouth.     Coenzyme Q10 (CO Q 10 PO) Take 2 tablets by mouth daily.     Evolocumab (REPATHA SURECLICK) 140 MG/ML SOAJ Inject 1 mL into the skin every 14  (fourteen) days. 2 mL 11   fish oil-omega-3 fatty acids 1000 MG capsule Take 2 g by mouth daily.     glimepiride (AMARYL) 1 MG tablet Take 1 mg by mouth daily with breakfast.     JARDIANCE 10 MG TABS tablet Take 10 mg by mouth daily. (Patient not taking: Reported on 01/30/2024)     metFORMIN (GLUCOPHAGE-XR) 500 MG 24 hr tablet Take 1,000 mg by mouth in the morning and at bedtime.     olmesartan (BENICAR) 20 MG tablet Take 0.5 tablets (10 mg total) by mouth daily. 45 tablet 3   omeprazole (PRILOSEC OTC) 20 MG tablet Take 20 mg by mouth 2 (two) times a week.     No current facility-administered medications for this visit.    Allergies:   Lisinopril, Zocor [simvastatin], and Ezetimibe    Social History:  The patient  reports that he has quit smoking. He has never used smokeless tobacco. He reports current alcohol use. He reports that he does not use drugs.   Family History:  The patient's family history includes Heart disease in his father and mother; Hypertension in his mother.    ROS:  Please see the history of present illness.   Otherwise, review of systems are positive  for difficulty maintaining weight loss.   All other systems are reviewed and negative.    PHYSICAL EXAM: VS:  BP 138/78   Pulse 71   Ht 5\' 10"  (1.778 m)   Wt 211 lb (95.7 kg)   SpO2 98%   BMI 30.28 kg/m  , BMI Body mass index is 30.28 kg/m. GEN: Well nourished, well developed, in no acute distress HEENT: normal Neck: no JVD, carotid bruits, or masses Cardiac: RRR; no murmurs, rubs, or gallops,no edema  Respiratory:  clear to auscultation bilaterally, normal work of breathing GI: soft, nontender, nondistended, + BS MS: no deformity or atrophy Skin: warm and dry, no rash Neuro:  Strength and sensation are intact Psych: euthymic mood, full affect   EKG:   The ekg ordered in 02/2021 demonstrates NSR, PVC   Recent Labs: No results found for requested labs within last 365 days.   Lipid Panel    Component  Value Date/Time   CHOL 219 (H) 01/28/2014 1036   TRIG 93.0 01/28/2014 1036   HDL 40.60 01/28/2014 1036   CHOLHDL 5 01/28/2014 1036   VLDL 18.6 01/28/2014 1036   LDLCALC 140 (H) 09/26/2013 0753   LDLDIRECT 161.8 01/28/2014 1036     Other studies Reviewed: Additional studies/ records that were reviewed today with results demonstrating: labs reviewed; negative stress test in 2015.   ASSESSMENT AND PLAN:  CAD: No angina on medical therapy. Continue aggressive secondary prevention.  Negative ETT in 2015.  Given his lack of typical anginal symptoms will order f/u ETT  DM: A1C 7.0.  Avoid concentrated sweets.  Avoid processed foods.  Increase fiber intake.  A1C Significantly improved from 12.  Hyperlipidemia: LDL 63 with primary continue repatha HTN: Has had orthostatic sx with higher doses of olmesartan. BP at home home has improved.  No orthostatic sx on current dose.  Home BPs not checked frequently, but in the normal range when checked.  Quit smoking in 2002.    ETT   Disposition:   FU in 1 year   Signed, Charlton Haws, MD  01/30/2024 8:10 AM    [facility] [address] [place], [place], [place]  [address] Phone: [phone]; Fax: [phone]

## 2024-01-30 ENCOUNTER — Encounter: Payer: Self-pay | Admitting: Cardiovascular Disease

## 2024-01-30 ENCOUNTER — Ambulatory Visit: Payer: BC Managed Care – PPO | Attending: Cardiovascular Disease | Admitting: Cardiovascular Disease

## 2024-01-30 VITALS — BP 138/78 | HR 71 | Ht 70.0 in | Wt 211.0 lb

## 2024-01-30 DIAGNOSIS — I10 Essential (primary) hypertension: Secondary | ICD-10-CM | POA: Diagnosis not present

## 2024-01-30 DIAGNOSIS — I2510 Atherosclerotic heart disease of native coronary artery without angina pectoris: Secondary | ICD-10-CM

## 2024-01-30 DIAGNOSIS — E782 Mixed hyperlipidemia: Secondary | ICD-10-CM | POA: Diagnosis not present

## 2024-01-30 NOTE — Patient Instructions (Addendum)
 Medication Instructions:  Your physician recommends that you continue on your current medications as directed. Please refer to the Current Medication list given to you today.  *If you need a refill on your cardiac medications before your next appointment, please call your pharmacy*  Lab Work: If you have labs (blood work) drawn today and your tests are completely normal, you will receive your results only by: MyChart Message (if you have MyChart) OR A paper copy in the mail If you have any lab test that is abnormal or we need to change your treatment, we will call you to review the results.  Testing/Procedures: Your physician has requested that you have an exercise tolerance test. For further information please visit [email or web address] Please also follow instruction sheet, as given.  Follow-Up: At [facility], you and your health needs are our priority.  As part of our continuing mission to provide you with exceptional heart care, we have created designated Provider Care Teams.  These Care Teams include your primary Cardiologist (physician) and Advanced Practice Providers (APPs -  Physician Assistants and Nurse Practitioners) who all work together to provide you with the care you need, when you need it.  We recommend signing up for the patient portal called "MyChart".  Sign up information is provided on this After Visit Summary.  MyChart is used to connect with patients for Virtual Visits (Telemedicine).  Patients are able to view lab/test results, encounter notes, upcoming appointments, etc.  Non-urgent messages can be sent to your provider as well.   To learn more about what you can do with MyChart, go to [email or web address].    Your next appointment:   1 year(s)  Provider:   Charlton Haws, MD     Other Instructions                        Patient Instructions for Stress Test  Medication instructions:   You do not need to hold any mediction.  Do not eat, drink or use  tobacco products four hours prior to the test.  Water is ok.  3.  Dress prepared to exercise in a comfortable, two piece clothing outfit and walking shoes.  4.  Bring any current prescription medications with you the day of the test.  5.  Notify the office 24 hours in advance if you cannot keep this appointment.  6.  If you have any questions, please call [phone].       1st Floor: - Lobby - Registration  - Pharmacy  - Lab - Cafe  2nd Floor: - PV Lab - Diagnostic Testing (echo, CT, nuclear med)  3rd Floor: - Vacant  4th Floor: - TCTS (cardiothoracic surgery) - AFib Clinic - Structural Heart Clinic - Vascular Surgery  - Vascular Ultrasound  5th Floor: - HeartCare Cardiology (general and EP) - Clinical Pharmacy for coumadin, hypertension, lipid, weight-loss medications, and med management appointments    Valet parking services will be available as well.

## 2024-02-26 DIAGNOSIS — E785 Hyperlipidemia, unspecified: Secondary | ICD-10-CM | POA: Diagnosis not present

## 2024-02-26 DIAGNOSIS — Z23 Encounter for immunization: Secondary | ICD-10-CM | POA: Diagnosis not present

## 2024-02-26 DIAGNOSIS — I2510 Atherosclerotic heart disease of native coronary artery without angina pectoris: Secondary | ICD-10-CM | POA: Diagnosis not present

## 2024-02-26 DIAGNOSIS — E1169 Type 2 diabetes mellitus with other specified complication: Secondary | ICD-10-CM | POA: Diagnosis not present

## 2024-02-26 DIAGNOSIS — I10 Essential (primary) hypertension: Secondary | ICD-10-CM | POA: Diagnosis not present

## 2024-03-06 ENCOUNTER — Ambulatory Visit: Payer: BC Managed Care – PPO | Attending: Cardiology

## 2024-03-06 DIAGNOSIS — I2510 Atherosclerotic heart disease of native coronary artery without angina pectoris: Secondary | ICD-10-CM | POA: Diagnosis not present

## 2024-03-06 DIAGNOSIS — I10 Essential (primary) hypertension: Secondary | ICD-10-CM

## 2024-03-06 LAB — EXERCISE TOLERANCE TEST
Angina Index: 0
Duke Treadmill Score: 9
Estimated workload: 10.1
Exercise duration (min): 9 min
Exercise duration (sec): 0 s
MPHR: 156 {beats}/min
Peak HR: 153 {beats}/min
Percent HR: 98 %
RPE: 16
Rest HR: 75 {beats}/min
ST Depression (mm): 0 mm

## 2024-07-14 DIAGNOSIS — Z947 Corneal transplant status: Secondary | ICD-10-CM | POA: Diagnosis not present

## 2024-07-14 DIAGNOSIS — H40013 Open angle with borderline findings, low risk, bilateral: Secondary | ICD-10-CM | POA: Diagnosis not present

## 2024-07-14 DIAGNOSIS — E119 Type 2 diabetes mellitus without complications: Secondary | ICD-10-CM | POA: Diagnosis not present

## 2024-07-14 DIAGNOSIS — H2512 Age-related nuclear cataract, left eye: Secondary | ICD-10-CM | POA: Diagnosis not present

## 2024-09-01 ENCOUNTER — Encounter: Payer: Self-pay | Admitting: Orthopaedic Surgery

## 2024-09-01 ENCOUNTER — Ambulatory Visit: Admitting: Orthopaedic Surgery

## 2024-09-01 ENCOUNTER — Other Ambulatory Visit (INDEPENDENT_AMBULATORY_CARE_PROVIDER_SITE_OTHER): Payer: Self-pay

## 2024-09-01 DIAGNOSIS — M25552 Pain in left hip: Secondary | ICD-10-CM | POA: Diagnosis not present

## 2024-09-01 MED ORDER — METHYLPREDNISOLONE ACETATE 40 MG/ML IJ SUSP
40.0000 mg | INTRAMUSCULAR | Status: AC | PRN
  Administered 2024-09-01: 40 mg via INTRA_ARTICULAR

## 2024-09-01 MED ORDER — LIDOCAINE HCL 1 % IJ SOLN
3.0000 mL | INTRAMUSCULAR | Status: AC | PRN
  Administered 2024-09-01: 3 mL

## 2024-09-01 NOTE — Progress Notes (Signed)
 The patient is a very pleasant 62 year old gentleman I am seeing for the first time with left hip and groin pain for about a year now.  This mainly been the lateral aspect of his hip as source of his pain but has had a little bit of groin pain as well with no known injury.  He is not diabetic but reports his last hemoglobin A1c in March was 6.6.  He is not obese.  I did review his medications and past medical history in epic.  He does take a lot of ibuprofen and help him feel better when he is going to work.  On exam his left and right hips move smoothly and fluidly with no blocks to rotation and no pain in the groin at all.  Certainly his groin pain may be potentially a hernia or something going on with the inguinal ligament or even the hip flexor tendons.  Again his range of motion is entirely full and pain-free.  When I have him lay on his right side of his left hip up he is tender over the trochanteric area of the left hip.  An AP pelvis and lateral of the left hip shows normal-appearing hip joints bilaterally with concentric space and equal space on both sides.  There is some spurring at the tip of the greater trochanter which may be where his symptoms are in terms of the hip itself.  I did recommend a steroid injection over the left hip trochanteric area and he agreed to this and tolerated well.  He will watch his blood glucose closely because of the detrimental effects that steroids can have on blood glucose.  I would like him to try Voltaren gel 2-3 times daily over this area as well.  We will then see him back in a month to see how he is doing overall.  We may end up needing to MRI of the left hip and if he continues to have groin pain.    Procedure Note  Patient: Johnny Dorsey             Date of Birth: 17-Apr-1961           MRN: [id number]             Visit Date: 09/01/2024  Procedures: Visit Diagnoses:  1. Pain in left hip     Large Joint Inj: L greater trochanter on 09/01/2024 3:24  PM Indications: pain and diagnostic evaluation Details: 22 G 1.5 in needle, lateral approach  Arthrogram: No  Medications: 3 mL lidocaine 1 %; 40 mg methylPREDNISolone acetate 40 MG/ML Outcome: tolerated well, no immediate complications Procedure, treatment alternatives, risks and benefits explained, specific risks discussed. Consent was given by the patient. Immediately prior to procedure a time out was called to verify the correct patient, procedure, equipment, support staff and site/side marked as required. Patient was prepped and draped in the usual sterile fashion.

## 2024-09-12 DIAGNOSIS — Z23 Encounter for immunization: Secondary | ICD-10-CM | POA: Diagnosis not present

## 2024-09-12 DIAGNOSIS — I10 Essential (primary) hypertension: Secondary | ICD-10-CM | POA: Diagnosis not present

## 2024-09-12 DIAGNOSIS — E782 Mixed hyperlipidemia: Secondary | ICD-10-CM | POA: Diagnosis not present

## 2024-09-12 DIAGNOSIS — Z0000 Encounter for general adult medical examination without abnormal findings: Secondary | ICD-10-CM | POA: Diagnosis not present

## 2024-09-12 DIAGNOSIS — E785 Hyperlipidemia, unspecified: Secondary | ICD-10-CM | POA: Diagnosis not present

## 2024-09-12 DIAGNOSIS — E1169 Type 2 diabetes mellitus with other specified complication: Secondary | ICD-10-CM | POA: Diagnosis not present

## 2024-10-01 ENCOUNTER — Encounter: Payer: Self-pay | Admitting: Orthopaedic Surgery

## 2024-10-01 ENCOUNTER — Ambulatory Visit: Admitting: Orthopaedic Surgery

## 2024-10-01 DIAGNOSIS — M25552 Pain in left hip: Secondary | ICD-10-CM

## 2024-10-01 NOTE — Progress Notes (Signed)
 The patient is a 63 year old who comes in for follow-up after having a steroid injection over the lateral aspect of his left hip about a month ago.  He says the hip is doing great.  He did stepped wrong hard off of a pallet recently and he said the hip was more sore after that but it is coming down.  He is not pain-free but significantly better.  On his exam his left hip moves smoothly and fluidly with no blocks or rotation.  There is some pain over the trochanteric area but is not as significant as it was before.  He will continue stretching exercises and I reiterated the importance of that.  We can also repeat a steroid injection sometime later in December if needed.  He will contact us  if that is the case.  Follow-up is as needed otherwise.

## 2024-10-06 ENCOUNTER — Encounter: Payer: Self-pay | Admitting: Radiology

## 2024-12-04 DEATH — deceased

## 2024-12-08 ENCOUNTER — Ambulatory Visit (INDEPENDENT_AMBULATORY_CARE_PROVIDER_SITE_OTHER): Admitting: Otolaryngology

## 2024-12-08 ENCOUNTER — Encounter (INDEPENDENT_AMBULATORY_CARE_PROVIDER_SITE_OTHER): Payer: Self-pay | Admitting: Otolaryngology

## 2024-12-08 VITALS — BP 160/84 | HR 82 | Ht 70.0 in | Wt 194.0 lb

## 2024-12-08 DIAGNOSIS — R0982 Postnasal drip: Secondary | ICD-10-CM | POA: Diagnosis not present

## 2024-12-08 DIAGNOSIS — J342 Deviated nasal septum: Secondary | ICD-10-CM | POA: Diagnosis not present

## 2024-12-08 DIAGNOSIS — R490 Dysphonia: Secondary | ICD-10-CM

## 2024-12-08 DIAGNOSIS — J328 Other chronic sinusitis: Secondary | ICD-10-CM

## 2024-12-08 DIAGNOSIS — J3489 Other specified disorders of nose and nasal sinuses: Secondary | ICD-10-CM

## 2024-12-08 DIAGNOSIS — R0981 Nasal congestion: Secondary | ICD-10-CM

## 2024-12-08 DIAGNOSIS — J339 Nasal polyp, unspecified: Secondary | ICD-10-CM

## 2024-12-08 DIAGNOSIS — J343 Hypertrophy of nasal turbinates: Secondary | ICD-10-CM

## 2024-12-08 MED ORDER — PREDNISONE 10 MG PO TABS: 20.0000 mg | ORAL_TABLET | Freq: Every day | ORAL | 0 refills | Status: AC

## 2024-12-08 NOTE — Progress Notes (Signed)
 Dear Dr. Alben, Here is my assessment for our mutual patient, Johnny Dorsey. Thank you for allowing me the opportunity to care for your patient. Please do not hesitate to contact me should you have any other questions. Sincerely, Dr. Eldora Blanch  Otolaryngology Clinic Note  HISTORY: Johnny Dorsey is a 64 y.o. male indly referred by Dr. Alben for evaluation of chronic sinonasal complaints  Initial visit (12/2024): Discussed the use of AI scribe software for clinical note transcription with the patient, who gave verbal consent to proceed.  History of Present Illness Johnny Dorsey is a 64 year old male with diabetes and hyperlipidemia who presents with chronic right-sided nasal obstruction, now getting worse  He reports 1-2 years of persistent right-sided nasal obstruction with near-complete blockage on the right and intermittent left-sided obstruction, worse at night. Symptoms occur year-round, worsen from October to early January, and improve somewhat in summer. He often mouth breathes at night, cannot effectively blow his nose on the right, and occasionally feels something is in the nose when forcefully trying to clear it. He has frequent sneezing fits and occasional complete bilateral nasal obstruction at night.  He has chronic A/P mucoid rhinorrhea and notes seasonal worsening in the fall, possibly related to dust exposure. He denies significant ocular or nasal pruritus, nasal trauma, or prior nasal or sinus surgery.  He has about one episode per year of acute sinusitis in the fall, with increased facial pressure, frontal dull headaches, discolored nasal discharge, and increased congestion. These have been treated with antibiotics and steroids in the past, but not recently.  He has used intranasal fluticasone without significant benefit. Prior oxymetazoline provided immediate relief but led to worsening obstruction, so he stopped it. He currently continues fluticasone and uses homeopathic  remedies, including a tea that gives some relief.  He has postnasal drainage, especially in the mornings, causing a scratchy throat and frequent throat clearing. About one month ago he had a severe sore throat and transient hypophonia/laryngitis sx for about a week, with recurrent hoarseness and intermittent voice loss since, which he notes is worse with increased drainage. No trouble with swallowing, weight loss, hemoptysis. Denies GERD sx  Allergy testing has not been done --- intermittent AR symptoms, fall. No previous sinonasal surgery.  He is currently using flonase. Prior tried afrin, flonase, PO antihistamine  AP/AC: ASA 81  Tobacco: prior, quit  PMHx: DM, HLD, HTN, CAD s/p stent (2002), GERD, low risk OAG  RADIOGRAPHIC EVALUATION AND INDEPENDENT REVIEW OF OTHER RECORDS:: Johnny Dorsey (12.09/2024) Referral notes reviewed and uploaded or available in chart in media tab - noted chronic rhinitis, not needed flonase; also noted chronic sinus issues; Rx: ref to [facility] (09/15/2024) in media tab: CBC and MP: WBC 7.8, Eos 200; BUN/Cr 16/0.91   Past Medical History:  Diagnosis Date   Allergic rhinitis    CAD (coronary artery disease)    DM (diabetes mellitus) (HCC)    GERD (gastroesophageal reflux disease)    Hyperlipidemia    Hypertension    Past Surgical History:  Procedure Laterality Date   CORONARY STENT PLACEMENT  2002   Family History  Problem Relation Age of Onset   Hypertension Mother    Heart disease Mother    Heart disease Father    Social History   Tobacco Use   Smoking status: Former   Smokeless tobacco: Never  Substance Use Topics   Alcohol use: Yes   Allergies[1] Current Outpatient Medications  Medication Sig Dispense Refill   aspirin  EC 81 MG  tablet Take 1 tablet (81 mg total) by mouth daily. Swallow whole. 90 tablet 3   cholecalciferol (VITAMIN D ) 400 UNITS TABS tablet Take 1,000 Units by mouth.     Coenzyme Q10 (CO Q 10 PO) Take 2 tablets by mouth  daily.     EPINEPHrine 0.3 mg/0.3 mL IJ SOAJ injection Inject 0.3 mg into the muscle as needed.     Evolocumab  (REPATHA  SURECLICK) 140 MG/ML SOAJ Inject 1 mL into the skin every 14 (fourteen) days. 2 mL 11   fish oil-omega-3 fatty acids 1000 MG capsule Take 2 g by mouth daily.     glimepiride (AMARYL) 1 MG tablet Take 1 mg by mouth daily with breakfast.     metFORMIN (GLUCOPHAGE-XR) 500 MG 24 hr tablet Take 1,000 mg by mouth in the morning and at bedtime.     olmesartan  (BENICAR ) 20 MG tablet Take 0.5 tablets (10 mg total) by mouth daily. 45 tablet 3   omeprazole (PRILOSEC OTC) 20 MG tablet Take 20 mg by mouth 2 (two) times a week.     predniSONE  (DELTASONE ) 10 MG tablet Take 2 tablets (20 mg total) by mouth daily with breakfast for 7 days. 14 tablet 0   JARDIANCE 10 MG TABS tablet Take 10 mg by mouth daily. (Patient not taking: Reported on 12/08/2024)     No current facility-administered medications for this visit.   BP (!) 160/84 (BP Location: Left Arm, Patient Position: Sitting, Cuff Size: Large)   Pulse 82   Ht 5' 10 (1.778 m)   Wt 194 lb (88 kg)   SpO2 95%   BMI 27.84 kg/m   PHYSICAL EXAM:  BP (!) 160/84 (BP Location: Left Arm, Patient Position: Sitting, Cuff Size: Large)   Pulse 82   Ht 5' 10 (1.778 m)   Wt 194 lb (88 kg)   SpO2 95%   BMI 27.84 kg/m    Salient findings:  CN II-XII intact Bilateral EAC clear and TM intact with well pneumatized middle ear spaces Nose: Anterior rhinoscopy reveals septum dev left, with R>L IT hypertrophy.  Nasal endoscopy was indicated to better evaluate the nose and paranasal sinuses, given the patient's history and exam findings, and is detailed below. No lesions of oral cavity/oropharynx No obviously palpable neck masses/lymphadenopathy/thyromegaly No respiratory distress or stridor; voice quality class 1.5; TFL was indicated to better evaluate the proximal airway, given the patient's history and exam findings, and is detailed  below.  PROCEDURE:  Prior to initiating any procedures, risks/benefits/alternatives were explained to the patient and verbal consent obtained. Diagnostic Nasal Endoscopy Pre-procedure diagnosis: Concern for chronic sinusitis, nasal obstruction Post-procedure diagnosis: same Indication: See pre-procedure diagnosis and physical exam above Complications: None apparent EBL: 0 mL Anesthesia: Lidocaine  4% and topical decongestant was topically sprayed in each nasal cavity  Description of Procedure:  Patient was identified. A rigid 30 degree endoscope was utilized to evaluate the sinonasal cavities, mucosa, sinus ostia and turbinates and septum.  Overall, signs of mucosal inflammation are noted.   No mucopurulence noted Right Middle meatus: clear Right SE Recess: clear Left MM: clear Left SE Recess: clear  B/l polyps -- right from likely MT; left from likely superior turbinate area - likely inflammatory, does not appear to be meninges/CSF   Photodocumentation was obtained.  CPT CODE -- 68768 - Mod 25  Procedure Note Pre-procedure diagnosis:  Dysphonia Post-procedure diagnosis: Same Procedure: Transnasal Fiberoptic Laryngoscopy, CPT 31575 - Mod 25 Indication: see above Complications: None apparent EBL: 0 mL  The  procedure was undertaken to further evaluate the patient's complaint above, with mirror exam inadequate for appropriate examination due to gag reflex and poor patient tolerance  Procedure:  Patient was identified as correct patient. Verbal consent was obtained. The nose was sprayed with oxymetazoline and 4% lidocaine . The The flexible laryngoscope was passed through the nose to view the nasal cavity, pharynx (oropharynx, hypopharynx) and larynx.  The larynx was examined at rest and during multiple phonatory tasks. Documentation was obtained and reviewed with patient. The scope was removed. The patient tolerated the procedure well.  Findings: The nasal cavity and nasopharynx did  not reveal any masses or lesions, mucosa appeared to be without obvious lesions. The tongue base, pharyngeal walls, piriform sinuses, vallecula, epiglottis and postcricoid region are normal in appearance EXCEPT: mild global erythema, modest AP compression supraglottis consistent with MTD: no masses, age appropriate VF atrophy. The visualized portion of the subglottis and proximal trachea is widely patent. The vocal folds are mobile bilaterally. There are no lesions on the free edge of the vocal folds nor elsewhere in the larynx worrisome for malignancy.    Electronically signed by: Eldora KATHEE Blanch, MD 12/08/2024 3:02 PM    ASSESSMENT:  64 y.o. with:  1. Other chronic sinusitis   2. Nasal congestion   3. Hypertrophy of both inferior nasal turbinates   4. Nasal septal deviation   5. Nasal obstruction   6. Post-nasal drip   7. Dysphonia   8. Nasal polyposis    Likely congestion due to b/l polyps and septal deviation and ITH; does have h/o sinusitis but only about once/year and he denies any significant residual symptoms in interim except congestion. Endo as above  Dysphonia most likely post-viral laryngitis, and some MTD; TFL overall without masses; we discussed options and he opted for observation  PLAN: We've discussed issues and options today.  We reviewed the endoscopy images together.  The risks, benefits and alternatives were discussed and questions answered.  He has elected to proceed with:  1) Pred 20x7d for polyposis and congestion 2) Continue flonase BID 3) Consider daily irrigations 4) Post-treatment CT (no abx as no purulence currently) 5) Good laryngeal hygiene Follow-up in 4 weeks -- sooner as necessary.  See below regarding exact medications prescribed this encounter including dosages and route: Meds ordered this encounter  Medications   predniSONE  (DELTASONE ) 10 MG tablet    Sig: Take 2 tablets (20 mg total) by mouth daily with breakfast for 7 days.    Dispense:  14  tablet    Refill:  0     Thank you for allowing me the opportunity to care for your patient. Please do not hesitate to contact me should you have any other questions.  Sincerely, Eldora Blanch, MD Otolaryngologist (ENT), [facility] Phone: [phone] Fax: [phone]  MDM:  Level 4: [id number] Complexity/Problems addressed: mod - chronic problem with exacerbation Data complexity: mod - independent review of notes, labs, ordering test - Morbidity: mod  - Prescription Drug prescribed or managed: y  12/08/2024, 3:02 PM      [1]  Allergies Allergen Reactions   Lisinopril      ED dry cough   Zocor [Simvastatin] Other (See Comments)    Leg pain and liver function increased   Ezetimibe     myalgia

## 2024-12-08 NOTE — Patient Instructions (Signed)
 Take 20mg  of prednisone  once daily for 7 days Use flonase two sprays daily I have ordered an imaging study for you to complete prior to your next visit. Please call [facility] Scheduling at [phone] to schedule your imaging if you have not received a call within 24 hours. If you are unable to complete your imaging study prior to your next scheduled visit please call our office to let us  know.  -- Schedule your CT scan in about 3 weeks, not immediately

## 2024-12-09 NOTE — Addendum Note (Signed)
 Addended by: Alila Sotero on: 12/09/2024 11:01 AM   Modules accepted: Orders

## 2024-12-15 ENCOUNTER — Ambulatory Visit: Admitting: Physician Assistant

## 2024-12-15 DIAGNOSIS — M7062 Trochanteric bursitis, left hip: Secondary | ICD-10-CM | POA: Diagnosis not present

## 2024-12-15 MED ORDER — METHYLPREDNISOLONE ACETATE 40 MG/ML IJ SUSP
40.0000 mg | INTRAMUSCULAR | Status: AC | PRN
  Administered 2024-12-15: 40 mg via INTRA_ARTICULAR

## 2024-12-15 MED ORDER — LIDOCAINE HCL 1 % IJ SOLN
3.0000 mL | INTRAMUSCULAR | Status: AC | PRN
  Administered 2024-12-15: 3 mL

## 2024-12-15 NOTE — Progress Notes (Signed)
" ° °  Procedure Note  Patient: Johnny Dorsey             Date of Birth: 1961-09-01           MRN: [id number]             Visit Date: 12/15/2024 HPI: Mr. Rickett comes in today requesting a left hip trochanteric injection.  He states last injection on 09/01/2024 was helpful.  He states the pain is not as bad as it was returned about a week ago.  He has had no fevers chills.  Denies any numbness tingling down the leg.  He reports that his hemoglobin A1c is under good control being approximately 6.0.  Review of systems: See HPI  Physical exam: General well-developed well-nourished male who ambulates without assistive device. Bilateral hips good range of motion.  Tenderness over the left hip trochanteric region.  Procedures: Visit Diagnoses:  1. Trochanteric bursitis, left hip     Large Joint Inj: L greater trochanter on 12/15/2024 11:12 AM Indications: pain Details: 22 G 1.5 in needle, lateral approach  Arthrogram: No  Medications: 3 mL lidocaine  1 %; 40 mg methylPREDNISolone  acetate 40 MG/ML Outcome: tolerated well, no immediate complications Procedure, treatment alternatives, risks and benefits explained, specific risks discussed. Consent was given by the patient. Immediately prior to procedure a time out was called to verify the correct patient, procedure, equipment, support staff and site/side marked as required. Patient was prepped and draped in the usual sterile fashion.      Plan: He shown IT band stretching exercises.  He will follow-up at his to wait least 3 months between injections.  Questions were encouraged and answered.  "

## 2024-12-17 ENCOUNTER — Encounter (INDEPENDENT_AMBULATORY_CARE_PROVIDER_SITE_OTHER): Payer: Self-pay | Admitting: Otolaryngology

## 2024-12-19 ENCOUNTER — Ambulatory Visit
Admission: RE | Admit: 2024-12-19 | Discharge: 2024-12-19 | Disposition: A | Discharge status: Home / Self Care | Source: Ambulatory Visit | Attending: Otolaryngology | Admitting: Otolaryngology

## 2024-12-19 DIAGNOSIS — J328 Other chronic sinusitis: Secondary | ICD-10-CM

## 2024-12-19 DIAGNOSIS — J342 Deviated nasal septum: Secondary | ICD-10-CM

## 2024-12-19 DIAGNOSIS — J339 Nasal polyp, unspecified: Secondary | ICD-10-CM

## 2025-01-08 ENCOUNTER — Ambulatory Visit (INDEPENDENT_AMBULATORY_CARE_PROVIDER_SITE_OTHER): Admitting: Otolaryngology

## 2025-01-08 ENCOUNTER — Encounter (INDEPENDENT_AMBULATORY_CARE_PROVIDER_SITE_OTHER): Payer: Self-pay | Admitting: Otolaryngology

## 2025-01-08 VITALS — BP 145/79 | HR 82 | Ht 70.0 in | Wt 198.0 lb

## 2025-01-08 DIAGNOSIS — J342 Deviated nasal septum: Secondary | ICD-10-CM

## 2025-01-08 DIAGNOSIS — R0981 Nasal congestion: Secondary | ICD-10-CM

## 2025-01-08 DIAGNOSIS — J339 Nasal polyp, unspecified: Secondary | ICD-10-CM

## 2025-01-08 DIAGNOSIS — J3489 Other specified disorders of nose and nasal sinuses: Secondary | ICD-10-CM

## 2025-01-08 DIAGNOSIS — J328 Other chronic sinusitis: Secondary | ICD-10-CM

## 2025-01-08 DIAGNOSIS — J343 Hypertrophy of nasal turbinates: Secondary | ICD-10-CM

## 2025-01-08 NOTE — Patient Instructions (Addendum)
 Surgery codes --- CPT 30520, 30140 (bilateral), 68762 (bilateral), 68745 (right), 31256 (right), [phone]

## 2025-01-08 NOTE — Progress Notes (Signed)
 Dear Dr. Alben, Here is my assessment for our mutual patient, Johnny Dorsey. Thank you for allowing me the opportunity to care for your patient. Please do not hesitate to contact me should you have any other questions. Sincerely, Dr. Eldora Blanch  Otolaryngology Clinic Note  HISTORY: Johnny Dorsey is a 64 y.o. male indly referred by Dr. Alben for evaluation of chronic sinonasal complaints  Initial visit (12/2024): Discussed the use of AI scribe software for clinical note transcription with the patient, who gave verbal consent to proceed.  History of Present Illness Johnny Dorsey is a 64 year old male with diabetes and hyperlipidemia who presents with chronic right-sided nasal obstruction, now getting worse  He reports 1-2 years of persistent right-sided nasal obstruction with near-complete blockage on the right and intermittent left-sided obstruction, worse at night. Symptoms occur year-round, worsen from October to early January, and improve somewhat in summer. He often mouth breathes at night, cannot effectively blow his nose on the right, and occasionally feels something is in the nose when forcefully trying to clear it. He has frequent sneezing fits and occasional complete bilateral nasal obstruction at night.  He has chronic A/P mucoid rhinorrhea and notes seasonal worsening in the fall, possibly related to dust exposure. He denies significant ocular or nasal pruritus, nasal trauma, or prior nasal or sinus surgery.  He has about one episode per year of acute sinusitis in the fall, with increased facial pressure, frontal dull headaches, discolored nasal discharge, and increased congestion. These have been treated with antibiotics and steroids in the past, but not recently.  He has used intranasal fluticasone without significant benefit. Prior oxymetazoline provided immediate relief but led to worsening obstruction, so he stopped it. He currently continues fluticasone and uses homeopathic  remedies, including a tea that gives some relief.  He has postnasal drainage, especially in the mornings, causing a scratchy throat and frequent throat clearing. About one month ago he had a severe sore throat and transient hypophonia/laryngitis sx for about a week, with recurrent hoarseness and intermittent voice loss since, which he notes is worse with increased drainage. No trouble with swallowing, weight loss, hemoptysis. Denies GERD sx  Allergy testing has not been done --- intermittent AR symptoms, fall. No previous sinonasal surgery.  He is currently using flonase. Prior tried afrin, flonase, PO antihistamine  --------------------------------------------------------- 01/08/2025 Breathing better but still not all the way. Having no facial pressure or pain. We discussed his options. He continues to have PND and obstruction. Voice wise is he is doing much better.   AP/AC: ASA 81  Tobacco: prior, quit  PMHx: DM, HLD, HTN, CAD s/p stent (2002), GERD, low risk OAG  RADIOGRAPHIC EVALUATION AND INDEPENDENT REVIEW OF OTHER RECORDS:: Therisa Alben (12.09/2024) Referral notes reviewed and uploaded or available in chart in media tab - noted chronic rhinitis, not needed flonase; also noted chronic sinus issues; Rx: ref to [facility] (09/15/2024) in media tab: CBC and MP: WBC 7.8, Eos 200; BUN/Cr 16/0.91  CT Sinus independently interpreted (12/19/2024): noted sinuous septum with posterior left deviation; noted right middle turbinate originating what looks like a polyp extending to nasal floor; noted moderate b/l anterior ethmoid MPT, and right max MPT. Noted mucosal thickening left lower frontal sinus at frontoethmoid recess.  Past Medical History:  Diagnosis Date   Allergic rhinitis    CAD (coronary artery disease)    DM (diabetes mellitus) (HCC)    GERD (gastroesophageal reflux disease)    Hyperlipidemia    Hypertension    Past Surgical  History:  Procedure Laterality Date   CORONARY STENT  PLACEMENT  2002   Family History  Problem Relation Age of Onset   Hypertension Mother    Heart disease Mother    Heart disease Father    Social History   Tobacco Use   Smoking status: Former   Smokeless tobacco: Never  Substance Use Topics   Alcohol use: Yes   Allergies[1] Current Outpatient Medications  Medication Sig Dispense Refill   aspirin  EC 81 MG tablet Take 1 tablet (81 mg total) by mouth daily. Swallow whole. 90 tablet 3   cholecalciferol (VITAMIN D ) 400 UNITS TABS tablet Take 1,000 Units by mouth.     Coenzyme Q10 (CO Q 10 PO) Take 2 tablets by mouth daily.     CONTOUR NEXT TEST test strip daily.     EPINEPHrine 0.3 mg/0.3 mL IJ SOAJ injection Inject 0.3 mg into the muscle as needed.     Evolocumab  (REPATHA  SURECLICK) 140 MG/ML SOAJ Inject 1 mL into the skin every 14 (fourteen) days. 2 mL 11   fish oil-omega-3 fatty acids 1000 MG capsule Take 2 g by mouth daily.     glimepiride (AMARYL) 1 MG tablet Take 1 mg by mouth daily with breakfast.     JARDIANCE 10 MG TABS tablet Take 10 mg by mouth daily.     metFORMIN (GLUCOPHAGE-XR) 500 MG 24 hr tablet Take 1,000 mg by mouth in the morning and at bedtime.     olmesartan  (BENICAR ) 20 MG tablet Take 0.5 tablets (10 mg total) by mouth daily. 45 tablet 3   omeprazole (PRILOSEC OTC) 20 MG tablet Take 20 mg by mouth 2 (two) times a week.     No current facility-administered medications for this visit.   BP (!) 145/79 (BP Location: Left Arm, Patient Position: Sitting, Cuff Size: Large)   Pulse 82   Ht 5' 10 (1.778 m)   Wt 198 lb (89.8 kg)   SpO2 97%   BMI 28.41 kg/m   PHYSICAL EXAM:  BP (!) 145/79 (BP Location: Left Arm, Patient Position: Sitting, Cuff Size: Large)   Pulse 82   Ht 5' 10 (1.778 m)   Wt 198 lb (89.8 kg)   SpO2 97%   BMI 28.41 kg/m    Salient findings:  CN II-XII intact Nose: Anterior rhinoscopy reveals septum dev left, with R>L IT hypertrophy.  Nasal endoscopy was indicated to better evaluate the nose  and paranasal sinuses, given the patient's history and exam findings, and is detailed below. No lesions of oral cavity/oropharynx No obviously palpable neck masses/lymphadenopathy/thyromegaly No respiratory distress or stridor; voice quality class 1.5  PROCEDURE:  Prior to initiating any procedures, risks/benefits/alternatives were explained to the patient and verbal consent obtained. Diagnostic Nasal Endoscopy Pre-procedure diagnosis: Concern for chronic sinusitis, nasal obstruction, nasal polyps, reassess Post-procedure diagnosis: same Indication: See pre-procedure diagnosis and physical exam above Complications: None apparent EBL: 0 mL Anesthesia: Lidocaine  4% and topical decongestant was topically sprayed in each nasal cavity  Description of Procedure:  Patient was identified. A rigid 30 degree endoscope was utilized to evaluate the sinonasal cavities, mucosa, sinus ostia and turbinates and septum.  Overall, signs of mucosal inflammation are noted.   No mucopurulence noted Right Middle meatus: clear Right SE Recess: clear Left MM: clear Left SE Recess: clear  B/l polyps -- right from likely MT; left from likely superior turbinate area - likely inflammatory, does not appear to be meninges/CSF --- stable   Photodocumentation was obtained.  CPT  CODE -- 68768 - Mod 25  Electronically signed by: Eldora KATHEE Blanch, MD 01/08/2025 9:03 AM    ASSESSMENT:  64 y.o. with:  1. Nasal congestion   2. Hypertrophy of both inferior nasal turbinates   3. Nasal septal deviation   4. Nasal obstruction   5. Other chronic sinusitis   6. Nasal polyposis    Likely congestion due to b/l polyps and septal deviation and ITH; does have right sided sinonasal opacification. He is doing some better but continues to have some congestion. As such, we discussed options including septo/turbs/limited FESS/polypectomy.  We discussed the goals of sinus surgery, and expectations for postoperative management. We  discussed R/B/A including pain, infection, bleeding (~1% risk of operative visit for control), persistent symptoms, need for revision surgery, and other risks including damage to the eye (<<1%), and injury to skull base, anesthetic complications, among others.  Patient understands and is ready to proceed.  We discussed the goals of septoplasty and turbinate reduction, and expectations for postoperative management. Will plan to leave splints in place, and removal was also discussed. We also discussed nasal obstruction post-operatively until splints in place and pain management.  We discussed R/B/A including pain, infection, bleeding (~1% risk of operative visit for control), persistent symptoms, need for revision surgery, and other risks including damage to surrounding structures, septal perforation, and injury to skull base with risk of CSF leak and additional intracranial complications, anesthetic complications, among others.  We discussed use of nasal saline spray and nasal saline irrigations post-operatively We also discussed use of intranasal steroid post-operatively until healing occurs Patient understands and is ready to proceed.  PLAN: We've discussed issues and options today.  We reviewed the endoscopy images together.  The risks, benefits and alternatives were discussed and questions answered.  He has elected to proceed with:  1) Continue flonase BID 2) Consider daily irrigations 3) He wishes to proceed so will post  Stop ASA 81 1 week before surgery  F/u POD 5  See below regarding exact medications prescribed this encounter including dosages and route: No orders of the defined types were placed in this encounter.    Thank you for allowing me the opportunity to care for your patient. Please do not hesitate to contact me should you have any other questions.  Sincerely, Eldora Blanch, MD Otolaryngologist (ENT), [facility] Phone: [phone] Fax:  [phone]  MDM:  [phone] Complexity/Problems addressed: mod - chronic problems Data complexity: mod - independent interpretation of CT - Morbidity: mod - decision for surgery - Prescription Drug prescribed or managed: n  01/08/2025, 9:03 AM      [1]  Allergies Allergen Reactions   Lisinopril      ED dry cough   Zocor [Simvastatin] Other (See Comments)    Leg pain and liver function increased   Ezetimibe     myalgia
